# Patient Record
Sex: Female | Born: 1940 | Race: White | Hispanic: No | Smoking: Former smoker
Health system: Southern US, Community
[De-identification: ages and names within clinical notes are randomized; demographics above are authoritative.]

## PROBLEM LIST (undated history)

## (undated) DIAGNOSIS — Z9889 Other specified postprocedural states: Secondary | ICD-10-CM

## (undated) DIAGNOSIS — Z974 Presence of external hearing-aid: Secondary | ICD-10-CM

## (undated) DIAGNOSIS — I619 Nontraumatic intracerebral hemorrhage, unspecified: Secondary | ICD-10-CM

## (undated) DIAGNOSIS — M8580 Other specified disorders of bone density and structure, unspecified site: Secondary | ICD-10-CM

## (undated) DIAGNOSIS — R112 Nausea with vomiting, unspecified: Secondary | ICD-10-CM

## (undated) DIAGNOSIS — I341 Nonrheumatic mitral (valve) prolapse: Secondary | ICD-10-CM

## (undated) HISTORY — DX: Nontraumatic intracerebral hemorrhage, unspecified: I61.9

## (undated) HISTORY — DX: Other specified disorders of bone density and structure, unspecified site: M85.80

## (undated) HISTORY — PX: CLEFT PALATE REPAIR: SUR1165

## (undated) HISTORY — DX: Nonrheumatic mitral (valve) prolapse: I34.1

## (undated) HISTORY — PX: ABDOMINAL HYSTERECTOMY: SHX81

---

## 2000-08-08 HISTORY — PX: BUNIONECTOMY: SHX129

## 2003-08-09 DIAGNOSIS — I619 Nontraumatic intracerebral hemorrhage, unspecified: Secondary | ICD-10-CM

## 2003-08-09 HISTORY — DX: Nontraumatic intracerebral hemorrhage, unspecified: I61.9

## 2004-04-09 ENCOUNTER — Encounter: Admission: RE | Admit: 2004-04-09 | Discharge: 2004-04-09 | Payer: Self-pay | Admitting: Neurosurgery

## 2004-06-10 ENCOUNTER — Encounter: Admission: RE | Admit: 2004-06-10 | Discharge: 2004-06-10 | Payer: Self-pay | Admitting: Neurosurgery

## 2004-12-09 ENCOUNTER — Encounter: Admission: RE | Admit: 2004-12-09 | Discharge: 2004-12-09 | Payer: Self-pay | Admitting: Neurosurgery

## 2005-02-17 ENCOUNTER — Ambulatory Visit: Payer: Self-pay | Admitting: Internal Medicine

## 2005-03-01 ENCOUNTER — Ambulatory Visit: Payer: Self-pay | Admitting: Internal Medicine

## 2005-03-11 ENCOUNTER — Ambulatory Visit: Payer: Self-pay | Admitting: Internal Medicine

## 2005-03-25 ENCOUNTER — Ambulatory Visit: Payer: Self-pay | Admitting: Internal Medicine

## 2005-06-14 ENCOUNTER — Encounter: Admission: RE | Admit: 2005-06-14 | Discharge: 2005-06-14 | Payer: Self-pay | Admitting: Neurosurgery

## 2005-08-21 LAB — HM COLONOSCOPY

## 2007-11-20 LAB — HM COLONOSCOPY: HM Colonoscopy: NORMAL

## 2008-02-17 LAB — POC HEMOCCULT BLD/STL (HOME/3-CARD/SCREEN)

## 2008-05-17 LAB — HM COLONOSCOPY: HM Colonoscopy: NORMAL

## 2008-08-21 LAB — FECAL OCCULT BLOOD, GUAIAC: Fecal Occult Blood: NEGATIVE

## 2010-08-20 LAB — HM DEXA SCAN: HM Dexa Scan: NORMAL

## 2010-08-27 LAB — CBC AND DIFFERENTIAL
HCT: 46 % (ref 36–46)
Hemoglobin: 15.6 g/dL (ref 12.0–16.0)
Neutrophils Absolute: 4 /uL
Platelets: 201 10*3/uL (ref 150–399)
WBC: 6.7 10^3/mL

## 2010-08-27 LAB — LIPID PANEL
Cholesterol: 232 mg/dL — AB (ref 0–200)
HDL: 58 mg/dL (ref 35–70)
Triglycerides: 172 mg/dL — AB (ref 40–160)

## 2010-08-27 LAB — BASIC METABOLIC PANEL
BUN: 16 mg/dL (ref 4–21)
Creatinine: 1.1 mg/dL (ref 0.5–1.1)
Potassium: 5.3 mmol/L (ref 3.4–5.3)
Sodium: 143 mmol/L (ref 137–147)

## 2010-08-27 LAB — TSH: TSH: 1.94 u[IU]/mL (ref 0.41–5.90)

## 2010-08-27 LAB — HEPATIC FUNCTION PANEL: AST: 18 U/L (ref 13–35)

## 2010-08-29 ENCOUNTER — Encounter: Payer: Self-pay | Admitting: Internal Medicine

## 2011-10-10 ENCOUNTER — Encounter: Payer: Self-pay | Admitting: Internal Medicine

## 2011-11-16 ENCOUNTER — Encounter: Payer: Self-pay | Admitting: Internal Medicine

## 2011-11-16 ENCOUNTER — Ambulatory Visit (INDEPENDENT_AMBULATORY_CARE_PROVIDER_SITE_OTHER): Payer: Medicare Other | Admitting: Internal Medicine

## 2011-11-16 VITALS — BP 128/70 | HR 80 | Temp 98.4°F | Resp 16 | Ht 64.5 in | Wt 150.2 lb

## 2011-11-16 DIAGNOSIS — Z1211 Encounter for screening for malignant neoplasm of colon: Secondary | ICD-10-CM

## 2011-11-16 DIAGNOSIS — Z0000 Encounter for general adult medical examination without abnormal findings: Secondary | ICD-10-CM

## 2011-11-16 DIAGNOSIS — E785 Hyperlipidemia, unspecified: Secondary | ICD-10-CM

## 2011-11-16 DIAGNOSIS — H409 Unspecified glaucoma: Secondary | ICD-10-CM | POA: Insufficient documentation

## 2011-11-16 DIAGNOSIS — Z1239 Encounter for other screening for malignant neoplasm of breast: Secondary | ICD-10-CM

## 2011-11-16 LAB — FECAL OCCULT BLOOD, GUAIAC: Fecal Occult Blood: NEGATIVE

## 2011-11-16 NOTE — Progress Notes (Signed)
Patient ID: Maria Christensen, female   DOB: 1940-11-02, 71 y.o.   MRN: [id number]  The patient is here for annual Medicare wellness examination and management of other chronic and acute problems., which include a one year follow up on mild hypertriglyceridemia, mild osteopenia.  She feels great,  Last seen January 2012.  Is up to date on eye exams due to a history of open angle glaucoma.  Physicially active,  Walks 3 times weekly.  NO insomnia, unintentional weight loss,  Change in bowel habits.   The risk factors are reflected in the social history.  The roster of all physicians providing medical care to patient - is listed in the Snapshot section of the chart.  Activities of daily living:  The patient is 100% independent in all ADLs: dressing, toileting, feeding as well as independent mobility  Home safety : The patient has smoke detectors in the home. They wear seatbelts.  There are no firearms at home. There is no violence in the home.   There is no risks for hepatitis, STDs or HIV. There is no   history of blood transfusion. They have no travel history to infectious disease endemic areas of the world.  The patient has seen their dentist in the last six month. They have seen their eye doctor in the last year. They admit to slight hearing difficulty with regard to whispered voices and some television programs.  They have deferred audiologic testing in the last year.  They do not  have excessive sun exposure. Discussed the need for sun protection: hats, long sleeves and use of sunscreen if there is significant sun exposure.   Diet: the importance of a healthy diet is discussed. They do have a healthy diet.  The benefits of regular aerobic exercise were discussed. She walks 3 times per week ,  20 minutes.   Depression screen: there are no signs or vegative symptoms of depression- irritability, change in appetite, anhedonia, sadness/tearfullness.  Cognitive assessment: the patient manages all  their financial and personal affairs and is actively engaged. She could relate day,date,year and events; recalled 2/3 objects at 3 minutes; performed clock-face test normally.  The following portions of the patient's history were reviewed and updated as appropriate: allergies, current medications, past family history, past medical history,  past surgical history, past social history  and problem list.  Visual acuity was not assessed per patient preference since she has regular follow up with her ophthalmologist. Hearing and body mass index were assessed and reviewed.   During the course of the visit the patient was educated and counseled about appropriate screening and preventive services including : fall prevention , diabetes screening, nutrition counseling, colorectal cancer screening, and recommended immunizations.    Past Medical History  Diagnosis Date  . Cerebral hemorrhage 2005  . Cleft palate   . Mitral valve prolapse   . Open-angle glaucoma   . Osteopenia   . Herpes zoster   . Glaucoma     managed b [place],  bilateral   Current Outpatient Prescriptions on File Prior to Visit  Medication Sig Dispense Refill  . bimatoprost (LUMIGAN) 0.03 % ophthalmic solution 1 drop at bedtime.      . Calcium-Magnesium-Vitamin D (CITRACAL CALCIUM+D) 600-40-500 MG-MG-UNIT TB24 Take by mouth.      . dorzolamide-timolol (COSOPT) 22.3-6.8 MG/ML ophthalmic solution 1 drop 2 (two) times daily.       BP 128/70  Pulse 80  Temp(Src) 98.4 F (36.9 C) (Oral)  Resp 16  Ht 5'  4.5" (1.638 m)  Wt 150 lb 4 oz (68.153 kg)  BMI 25.39 kg/m2  SpO2 98%   General appearance: alert, cooperative and appears stated age Ears: normal TM's and external ear canals both ears Throat: lips, mucosa, and tongue normal; teeth and gums normal Neck: no adenopathy, no carotid bruit, supple, symmetrical, trachea midline and thyroid not enlarged, symmetric, no tenderness/mass/nodules Back: symmetric, no curvature. ROM  normal. No CVA tenderness. Lungs: clear to auscultation bilaterally Heart: regular rate and rhythm, S1, S2 normal, no murmur, click, rub or gallop Abdomen: soft, non-tender; bowel sounds normal; no masses,  no organomegaly Pulses: 2+ and symmetric Skin: Skin color, texture, turgor normal. No rashes or lesions Lymph nodes: Cervical, supraclavicular, and axillary nodes normal.  Assessment and Plan;  Glaucoma And macular degeneration, managed by [place] [facility] at [facility]  Hyperlipidemia LDL goal < 160 Last LDL was 140 on diet alone jan 2012. , HDL 59, trigs 170  No sigificant change over the years,  Will repeat in one year.   Screening for colon cancer she had a prior normal colonoscopy at [facility], followed by an incomplete on by gessner in 2007 and had negative FOBTS in 2009 and 2010.  Despite her FH of colon CA she preferrs to continue screening with annual FOBTs,  She was given a take home study today    Updated Medication List Outpatient Encounter Prescriptions as of 11/16/2011  Medication Sig Dispense Refill  . bimatoprost (LUMIGAN) 0.03 % ophthalmic solution 1 drop at bedtime.      . Calcium-Magnesium-Vitamin D (CITRACAL CALCIUM+D) 600-40-500 MG-MG-UNIT TB24 Take by mouth.      . dorzolamide-timolol (COSOPT) 22.3-6.8 MG/ML ophthalmic solution 1 drop 2 (two) times daily.      Marland Kitchen DISCONTD: brimonidine (ALPHAGAN) 0.2 % ophthalmic solution 1 drop 3 (three) times daily.      Marland Kitchen DISCONTD: Cholecalciferol (VITAMIN D3) 10000 UNITS capsule Take 10,000 Units by mouth daily.      Marland Kitchen DISCONTD: Omega-3 Fatty Acids (FISH OIL) 1200 MG CAPS Take by mouth.

## 2011-11-16 NOTE — Patient Instructions (Signed)
Consider the Low Glycemic Index Diet and 6 smaller meals daily :   7 AM Low carbohydrate Protein  Shakes (EAS Carb Control  Or Atkins ,  Available everywhere,   In  cases at [employer] )  2.5 carbs  (Add or substitute a toasted Arnold's sandwhich thin w/ peanut butter)  10 AM: Protein bar by Atkins (snack size,  Chocolate lover's variety at  [employer])    Lunch: sandwich on pita bread or flatbread (Joseph's makes a pita bread and a flat bread , available at [employer] and [employer]; Toufayah makes a low carb flatbread available at [employer] and [employer]) [employer] make whole wheat low carb tortillas   3 PM:  Mid day :  Another protein bar,  Or a  cheese stick, 1/4 cup of almonds, walnuts, pistachios, pecans, peanuts,  Macadamia nuts  6 PM  Dinner:  "mean and green:"  Meat/chicken/fish, salad, and green veggie : use ranch, vinagrette,  Blue cheese, etc  9 PM snack : Breyer's low carb fudgiscle or  ice cream bar (Carb Smart) Weight Watcher's ice cream bar , or another protein shake

## 2011-11-17 ENCOUNTER — Telehealth: Payer: Self-pay | Admitting: Internal Medicine

## 2011-11-17 NOTE — Telephone Encounter (Signed)
Mammogram was normal.   

## 2011-11-18 ENCOUNTER — Telehealth: Payer: Self-pay | Admitting: Internal Medicine

## 2011-11-18 MED ORDER — OCUVITE-LUTEIN PO CAPS: 1.0000 | ORAL_CAPSULE | Freq: Every day | ORAL | Status: DC

## 2011-11-18 MED ORDER — BRIMONIDINE TARTRATE 0.2 % OP SOLN: 1.0000 [drp] | Freq: Two times a day (BID) | OPHTHALMIC | Status: DC

## 2011-11-18 NOTE — Telephone Encounter (Signed)
This is being handled through MyChart.

## 2011-11-18 NOTE — Telephone Encounter (Signed)
[phone] Pt called was looking on my chart she saw some things that needs to be changed on meds list alphagan pt takes only 2 drop daily   Not 3 She takes over the counter ocouvite 1 daily  This is not on list  Medical summary  Pt stated she did shingles vaccine 06/12/06 Flu vaccine 05/24/11

## 2011-11-18 NOTE — Telephone Encounter (Signed)
Patient notified

## 2011-11-20 ENCOUNTER — Encounter: Payer: Self-pay | Admitting: Internal Medicine

## 2011-11-20 DIAGNOSIS — E785 Hyperlipidemia, unspecified: Secondary | ICD-10-CM | POA: Insufficient documentation

## 2011-11-20 DIAGNOSIS — Z1211 Encounter for screening for malignant neoplasm of colon: Secondary | ICD-10-CM | POA: Insufficient documentation

## 2011-11-20 NOTE — Assessment & Plan Note (Signed)
And macular degeneration, managed by [place] Brasington at [facility]

## 2011-11-20 NOTE — Assessment & Plan Note (Signed)
she had a prior normal colonoscopy at [facility], followed by an incomplete on by gessner in 2007 and had negative FOBTS in 2009 and 2010.  Despite her FH of colon CA she preferrs to continue screening with annual FOBTs,  She was given a take home study today

## 2011-11-20 NOTE — Assessment & Plan Note (Signed)
Last LDL was 140 on diet alone jan 2012. , HDL 59, trigs 170  No sigificant change over the years,  Will repeat in one year.

## 2011-11-25 ENCOUNTER — Encounter: Payer: Self-pay | Admitting: Internal Medicine

## 2011-12-01 ENCOUNTER — Encounter: Payer: Self-pay | Admitting: Internal Medicine

## 2012-01-12 ENCOUNTER — Encounter: Payer: Self-pay | Admitting: Internal Medicine

## 2012-05-17 ENCOUNTER — Encounter: Payer: Self-pay | Admitting: Internal Medicine

## 2012-05-17 ENCOUNTER — Ambulatory Visit (INDEPENDENT_AMBULATORY_CARE_PROVIDER_SITE_OTHER): Payer: Medicare Other | Admitting: Internal Medicine

## 2012-05-17 VITALS — BP 132/74 | HR 64 | Temp 97.6°F | Ht 64.5 in | Wt 137.0 lb

## 2012-05-17 DIAGNOSIS — H4010X0 Unspecified open-angle glaucoma, stage unspecified: Secondary | ICD-10-CM | POA: Insufficient documentation

## 2012-05-17 DIAGNOSIS — R5383 Other fatigue: Secondary | ICD-10-CM

## 2012-05-17 DIAGNOSIS — R7989 Other specified abnormal findings of blood chemistry: Secondary | ICD-10-CM

## 2012-05-17 DIAGNOSIS — Z1211 Encounter for screening for malignant neoplasm of colon: Secondary | ICD-10-CM

## 2012-05-17 DIAGNOSIS — E785 Hyperlipidemia, unspecified: Secondary | ICD-10-CM

## 2012-05-17 DIAGNOSIS — R5381 Other malaise: Secondary | ICD-10-CM

## 2012-05-17 DIAGNOSIS — Z13220 Encounter for screening for lipoid disorders: Secondary | ICD-10-CM

## 2012-05-17 DIAGNOSIS — E538 Deficiency of other specified B group vitamins: Secondary | ICD-10-CM

## 2012-05-17 DIAGNOSIS — G5790 Unspecified mononeuropathy of unspecified lower limb: Secondary | ICD-10-CM

## 2012-05-17 DIAGNOSIS — Z23 Encounter for immunization: Secondary | ICD-10-CM

## 2012-05-17 DIAGNOSIS — N951 Menopausal and female climacteric states: Secondary | ICD-10-CM

## 2012-05-17 LAB — CBC WITH DIFFERENTIAL/PLATELET
Basophils Absolute: 0 10*3/uL (ref 0.0–0.1)
Basophils Relative: 0.6 % (ref 0.0–3.0)
Eosinophils Absolute: 0.1 10*3/uL (ref 0.0–0.7)
Eosinophils Relative: 1.8 % (ref 0.0–5.0)
HCT: 47 % — ABNORMAL HIGH (ref 36.0–46.0)
Hemoglobin: 15.7 g/dL — ABNORMAL HIGH (ref 12.0–15.0)
Lymphocytes Relative: 23.2 % (ref 12.0–46.0)
Lymphs Abs: 1.6 10*3/uL (ref 0.7–4.0)
MCV: 92 fl (ref 78.0–100.0)
Monocytes Absolute: 0.6 10*3/uL (ref 0.1–1.0)
Neutro Abs: 4.4 10*3/uL (ref 1.4–7.7)
Neutrophils Relative %: 65.2 % (ref 43.0–77.0)
RBC: 5.11 Mil/uL (ref 3.87–5.11)
RDW: 13.4 % (ref 11.5–14.6)
WBC: 6.7 10*3/uL (ref 4.5–10.5)

## 2012-05-17 LAB — LIPID PANEL
Cholesterol: 222 mg/dL — ABNORMAL HIGH (ref 0–200)
HDL: 44.4 mg/dL (ref >39.00)
Total CHOL/HDL Ratio: 5
Triglycerides: 144 mg/dL (ref 0.0–149.0)
VLDL: 28.8 mg/dL (ref 0.0–40.0)

## 2012-05-17 LAB — COMPREHENSIVE METABOLIC PANEL
ALT: 14 U/L (ref 0–35)
AST: 19 U/L (ref 0–37)
Albumin: 4.1 g/dL (ref 3.5–5.2)
Alkaline Phosphatase: 61 U/L (ref 39–117)
BUN: 14 mg/dL (ref 6–23)
CO2: 26 mEq/L (ref 19–32)
Calcium: 9.9 mg/dL (ref 8.4–10.5)
Chloride: 106 mEq/L (ref 96–112)
Creatinine, Ser: 1 mg/dL (ref 0.4–1.2)
Potassium: 5.1 mEq/L (ref 3.5–5.1)
Sodium: 139 mEq/L (ref 135–145)
Total Bilirubin: 2.2 mg/dL — ABNORMAL HIGH (ref 0.3–1.2)

## 2012-05-17 LAB — TSH: TSH: 1.56 u[IU]/mL (ref 0.35–5.50)

## 2012-05-17 LAB — LDL CHOLESTEROL, DIRECT: Direct LDL: 154.7 mg/dL

## 2012-05-17 LAB — B12 AND FOLATE PANEL: Folate: 13.3 ng/mL (ref >5.9)

## 2012-05-17 NOTE — Patient Instructions (Addendum)
You are up to date on all screenings and immunizations  I will e mail you the results of your blood work including thyroid and b12 studies  If your hot flashes become more frequent , please call me and we will schedule a CT scan

## 2012-05-17 NOTE — Progress Notes (Signed)
Patient ID: Maria Christensen, female   DOB: 09/17/1940, 71 y.o.   MRN: [id number]  Patient Active Problem List  Diagnosis  . Glaucoma  . Hyperlipidemia LDL goal < 160  . Screening for colon cancer  . Open-angle glaucoma(365.1)  . Neuropathy of foot  . Menopausal hot flushes    Subjective:  CC:   Chief Complaint  Patient presents with  . Follow-up    flu shot    HPI:   Maria Tullochis a 71 y.o. female who presents Here for pelvic and breast but was only put in a 15 minutes slot, so neither was done.  Cc numbness and cold feeling in toes.  The symptoms are intermittent.  NO history of neuropathy or claudication.  2)   Right ear, draining, , no pain , some decreased hearing noted.  Symptoms present for a week.    Past Medical History  Diagnosis Date  . Cerebral hemorrhage 2005  . Cleft palate   . Mitral valve prolapse   . Osteopenia   . Herpes zoster   . 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 Glaucoma(365)     managed b [place],  bilateral    Past Surgical History  Procedure Date  . Abdominal hysterectomy   . Bunionectomy 2002  . Cleft palate repair     The following portions of the patient's history were reviewed and updated as appropriate: Allergies, current medications, and problem list.   Review of Systems:   12 Pt  review of systems was negative except those addressed in the HPI,     History   Social History  . Marital Status: Widowed    Spouse Name: N/A    Number of Children: N/A  . Years of Education: N/A   Occupational History  . retired    Social History Main Topics  . Smoking status: Former Smoker    Quit date: 11/15/1961  . Smokeless tobacco: Never Used  . Alcohol Use: No  . Drug Use: No  . Sexually Active: Not on file   Other Topics Concern  . Not on file   Social History Narrative   Lives alone    Objective:  BP 132/74  Pulse 64  Temp 97.6 F (36.4 C) (Oral)  Ht 5' 4.5" (1.638 m)  Wt 137 lb (62.143 kg)  BMI 23.15 kg/m2   SpO2 98%  General appearance: alert, cooperative and appears stated age Ears: normal TM's and external ear canals both ears Throat: lips, mucosa, and tongue normal; teeth and gums normal Neck: no adenopathy, no carotid bruit, supple, symmetrical, trachea midline and thyroid not enlarged, symmetric, no tenderness/mass/nodules Back: symmetric, no curvature. ROM normal. No CVA tenderness. Lungs: clear to auscultation bilaterally Heart: regular rate and rhythm, S1, S2 normal, no murmur, click, rub or gallop Abdomen: soft, non-tender; bowel sounds normal; no masses,  no organomegaly Pulses: 2+ and symmetric Skin: Skin color, texture, turgor normal. No rashes or lesions Lymph nodes: Cervical, supraclavicular, and axillary nodes normal. Neuro: grossly normal. Sensation intact to light touch .  DTRs normal.   Assessment and Plan:  Neuropathy of foot Check ing for b12 and thyroid deficiency.    Menopausal hot flushes She has had a sudden recurrence in hot flashes after many years of asymptomatic existence.  We discussed obtaining a CT of the abdomen and pelvis if hot flashes continue.    Updated Medication List Outpatient Encounter Prescriptions as of 05/17/2012  Medication Sig Dispense Refill  . bimatoprost (LUMIGAN) 0.03 % ophthalmic solution 1 drop  at bedtime.      . brimonidine (ALPHAGAN) 0.2 % ophthalmic solution Place 1 drop into both eyes 2 (two) times daily.  5 mL    . Calcium-Magnesium-Vitamin D (CITRACAL CALCIUM+D) 600-40-500 MG-MG-UNIT TB24 Take by mouth.      . dorzolamide-timolol (COSOPT) 22.3-6.8 MG/ML ophthalmic solution 1 drop 2 (two) times daily.      . fish oil-omega-3 fatty acids 1000 MG capsule Take 2 g by mouth daily.      . multivitamin-lutein (OCUVITE-LUTEIN) CAPS Take 1 capsule by mouth daily.    0     Orders Placed This Encounter  Procedures  . Flu vaccine greater than or equal to 3yo preservative free IM  . Fecal Occult Blood, Guaiac  . Lipid panel  .  Comprehensive metabolic panel  . TSH  . CBC with Differential  . B12 AND FOLATE PANEL  . LDL cholesterol, direct  . HM COLONOSCOPY    No Follow-up on file.

## 2012-05-20 ENCOUNTER — Encounter: Payer: Self-pay | Admitting: Internal Medicine

## 2012-05-20 DIAGNOSIS — G5790 Unspecified mononeuropathy of unspecified lower limb: Secondary | ICD-10-CM | POA: Insufficient documentation

## 2012-05-20 DIAGNOSIS — N951 Menopausal and female climacteric states: Secondary | ICD-10-CM | POA: Insufficient documentation

## 2012-05-20 NOTE — Assessment & Plan Note (Signed)
She has had a sudden recurrence in hot flashes after many years of asymptomatic existence.  We discussed obtaining a CT of the abdomen and pelvis if hot flashes continue.

## 2012-05-20 NOTE — Assessment & Plan Note (Signed)
Check ing for b12 and thyroid deficiency.

## 2012-05-22 NOTE — Addendum Note (Signed)
Addended by: Sherlene Shams on: 05/22/2012 01:07 PM   Modules accepted: Orders

## 2012-05-23 ENCOUNTER — Encounter: Payer: Self-pay | Admitting: Internal Medicine

## 2012-05-23 ENCOUNTER — Telehealth: Payer: Self-pay | Admitting: *Deleted

## 2012-05-23 NOTE — Telephone Encounter (Signed)
Opened in error

## 2012-11-20 ENCOUNTER — Encounter: Payer: Medicare Other | Admitting: Internal Medicine

## 2013-01-15 ENCOUNTER — Ambulatory Visit (INDEPENDENT_AMBULATORY_CARE_PROVIDER_SITE_OTHER): Payer: Medicare Other | Admitting: Adult Health

## 2013-01-15 ENCOUNTER — Ambulatory Visit (INDEPENDENT_AMBULATORY_CARE_PROVIDER_SITE_OTHER)
Admission: RE | Admit: 2013-01-15 | Discharge: 2013-01-15 | Disposition: A | Discharge status: Home / Self Care | Payer: Medicare Other | Source: Ambulatory Visit | Attending: Adult Health | Admitting: Adult Health

## 2013-01-15 ENCOUNTER — Encounter: Payer: Self-pay | Admitting: Adult Health

## 2013-01-15 VITALS — BP 140/80 | HR 70 | Temp 97.9°F | Resp 12 | Wt 143.5 lb

## 2013-01-15 DIAGNOSIS — M25559 Pain in unspecified hip: Secondary | ICD-10-CM

## 2013-01-15 DIAGNOSIS — R52 Pain, unspecified: Secondary | ICD-10-CM

## 2013-01-15 DIAGNOSIS — M25511 Pain in right shoulder: Secondary | ICD-10-CM

## 2013-01-15 DIAGNOSIS — M25551 Pain in right hip: Secondary | ICD-10-CM

## 2013-01-15 DIAGNOSIS — M25519 Pain in unspecified shoulder: Secondary | ICD-10-CM

## 2013-01-15 MED ORDER — CYCLOBENZAPRINE HCL 5 MG PO TABS: 5.0000 mg | ORAL_TABLET | Freq: Three times a day (TID) | ORAL | Status: DC | PRN

## 2013-01-15 NOTE — Patient Instructions (Addendum)
  I am sending you for an xray of your right shoulder and arm.  Also take flexeril for muscle spasms. This will make you sleepy.  Apply ice alternating with heat to the affected areas for 15 min at a time. Do this approximately 3-4 times daily.  Use a firm pillow between your knees when you lie on your side or under your knees when you lie on your back.  Return to clinic if your symptoms are not improving within 4 weeks or sooner if your symptoms worsen.

## 2013-01-15 NOTE — Assessment & Plan Note (Signed)
Pain in right shoulder radiating to arm. X-ray right shoulder. ? Bursitis. May need referral to orthopedics for joint injection. Continue Aleve. Flexeril for muscle spasms.

## 2013-01-15 NOTE — Progress Notes (Signed)
  Subjective:    Patient ID: Maria Christensen, female    DOB: 04-19-1941, 72 y.o.   MRN: [id number]  HPI  Patient is a pleasant 72 year old female who presents to clinic with complaints of right shoulder pain radiating down to her right arm. She is also complaining of right hip pain. Patient tells a story that in December 2013, she fell inside her home while bringing in groceries. At the time, she developed right shoulder pain but now the pain has begun to radiate down her arm.  Towards the middle of May, she developed right hip pain. She is uncertain if this pain may be related to the fall back in December. The pain became so severe that she was having trouble lifting herself out of a chair. She was having to use a walker for leverage. She has taken Aleve with good results. She reports that the pain has improved however she has a nagging constant hip discomfort.   Current Outpatient Prescriptions on File Prior to Visit  Medication Sig Dispense Refill  . bimatoprost (LUMIGAN) 0.03 % ophthalmic solution 1 drop at bedtime.      . brimonidine (ALPHAGAN) 0.2 % ophthalmic solution Place 1 drop into both eyes 2 (two) times daily.  5 mL    . Calcium-Magnesium-Vitamin D (CITRACAL CALCIUM+D) 600-40-500 MG-MG-UNIT TB24 Take by mouth.      . dorzolamide-timolol (COSOPT) 22.3-6.8 MG/ML ophthalmic solution 1 drop 2 (two) times daily.      . fish oil-omega-3 fatty acids 1000 MG capsule Take 2 g by mouth daily.      . multivitamin-lutein (OCUVITE-LUTEIN) CAPS Take 1 capsule by mouth daily.    0   No current facility-administered medications on file prior to visit.    Review of Systems  Musculoskeletal: Positive for back pain.       Right shoulder pain with radiation into the are. S/P fall in December.  Right hip pain since May. No radiculopathy  Neurological: Negative for weakness and numbness.   BP 140/80  Pulse 70  Temp(Src) 97.9 F (36.6 C) (Oral)  Resp 12  Wt 143 lb 8 oz (65.091 kg)  BMI 24.26  kg/m2  SpO2 99%    Objective:   Physical Exam  Constitutional: She is oriented to person, place, and time. She appears well-developed and well-nourished. No distress.  Cardiovascular: Normal rate and regular rhythm.   Pulmonary/Chest: Effort normal. No respiratory distress.  Musculoskeletal: She exhibits no edema.  4 range of motion in the upward and forward position bilateral upper extremities. Patient is unable to bring right arm behind her back. Tenderness posterior right hip upon palpation. Full range of motion.   Neurological: She is alert and oriented to person, place, and time. She has normal reflexes. No cranial nerve deficit. Coordination normal.  Skin: Skin is warm and dry.  Psychiatric: She has a normal mood and affect. Her behavior is normal. Judgment and thought content normal.          Assessment & Plan:

## 2013-01-15 NOTE — Assessment & Plan Note (Addendum)
Pain related to fall versus arthritis. Send for right hip x-ray. Flexeril for muscle spasms. Continue Aleve.

## 2013-01-16 ENCOUNTER — Other Ambulatory Visit: Payer: Self-pay | Admitting: Adult Health

## 2013-01-16 DIAGNOSIS — R52 Pain, unspecified: Secondary | ICD-10-CM

## 2013-03-13 ENCOUNTER — Other Ambulatory Visit: Payer: Self-pay

## 2013-03-18 ENCOUNTER — Encounter: Payer: Self-pay | Admitting: Internal Medicine

## 2013-05-28 ENCOUNTER — Telehealth: Payer: Self-pay | Admitting: Internal Medicine

## 2013-05-28 ENCOUNTER — Encounter: Payer: Self-pay | Admitting: Internal Medicine

## 2013-05-28 ENCOUNTER — Ambulatory Visit (INDEPENDENT_AMBULATORY_CARE_PROVIDER_SITE_OTHER): Payer: Medicare Other | Admitting: Internal Medicine

## 2013-05-28 VITALS — BP 138/70 | HR 75 | Temp 97.7°F | Resp 14 | Ht 65.5 in | Wt 141.0 lb

## 2013-05-28 DIAGNOSIS — M25559 Pain in unspecified hip: Secondary | ICD-10-CM

## 2013-05-28 DIAGNOSIS — Z90710 Acquired absence of both cervix and uterus: Secondary | ICD-10-CM | POA: Insufficient documentation

## 2013-05-28 DIAGNOSIS — Z0000 Encounter for general adult medical examination without abnormal findings: Secondary | ICD-10-CM | POA: Insufficient documentation

## 2013-05-28 DIAGNOSIS — Z1211 Encounter for screening for malignant neoplasm of colon: Secondary | ICD-10-CM

## 2013-05-28 DIAGNOSIS — D751 Secondary polycythemia: Secondary | ICD-10-CM

## 2013-05-28 DIAGNOSIS — M25519 Pain in unspecified shoulder: Secondary | ICD-10-CM

## 2013-05-28 DIAGNOSIS — Z01419 Encounter for gynecological examination (general) (routine) without abnormal findings: Secondary | ICD-10-CM

## 2013-05-28 DIAGNOSIS — Z23 Encounter for immunization: Secondary | ICD-10-CM

## 2013-05-28 DIAGNOSIS — E785 Hyperlipidemia, unspecified: Secondary | ICD-10-CM

## 2013-05-28 DIAGNOSIS — D239 Other benign neoplasm of skin, unspecified: Secondary | ICD-10-CM

## 2013-05-28 DIAGNOSIS — E559 Vitamin D deficiency, unspecified: Secondary | ICD-10-CM

## 2013-05-28 DIAGNOSIS — D229 Melanocytic nevi, unspecified: Secondary | ICD-10-CM

## 2013-05-28 DIAGNOSIS — R5381 Other malaise: Secondary | ICD-10-CM

## 2013-05-28 DIAGNOSIS — M25511 Pain in right shoulder: Secondary | ICD-10-CM

## 2013-05-28 DIAGNOSIS — M25551 Pain in right hip: Secondary | ICD-10-CM

## 2013-05-28 DIAGNOSIS — Z1239 Encounter for other screening for malignant neoplasm of breast: Secondary | ICD-10-CM

## 2013-05-28 LAB — COMPREHENSIVE METABOLIC PANEL
ALT: 16 U/L (ref 0–35)
CO2: 27 mEq/L (ref 19–32)
Calcium: 10 mg/dL (ref 8.4–10.5)
Chloride: 108 mEq/L (ref 96–112)
Creatinine, Ser: 1.1 mg/dL (ref 0.4–1.2)
GFR: 52.26 mL/min — ABNORMAL LOW (ref >60.00)
Glucose, Bld: 105 mg/dL — ABNORMAL HIGH (ref 70–99)
Sodium: 144 mEq/L (ref 135–145)
Total Protein: 7.1 g/dL (ref 6.0–8.3)

## 2013-05-28 LAB — CBC WITH DIFFERENTIAL/PLATELET
Basophils Absolute: 0 10*3/uL (ref 0.0–0.1)
Eosinophils Absolute: 0.1 10*3/uL (ref 0.0–0.7)
Lymphocytes Relative: 25.6 % (ref 12.0–46.0)
MCHC: 34.8 g/dL (ref 30.0–36.0)
Neutrophils Relative %: 63.6 % (ref 43.0–77.0)
RDW: 13.4 % (ref 11.5–14.6)

## 2013-05-28 LAB — TSH: TSH: 0.97 u[IU]/mL (ref 0.35–5.50)

## 2013-05-28 LAB — LIPID PANEL
Cholesterol: 239 mg/dL — ABNORMAL HIGH (ref 0–200)
Triglycerides: 134 mg/dL (ref 0.0–149.0)

## 2013-05-28 NOTE — Telephone Encounter (Signed)
Immunizations already documented in chart.

## 2013-05-28 NOTE — Telephone Encounter (Signed)
Chart updated

## 2013-05-28 NOTE — Assessment & Plan Note (Signed)
Symptoms resolved. Bone spur noted on MRI

## 2013-05-28 NOTE — Telephone Encounter (Signed)
Pt stated on her AVS that it stated she needed a shingles and TDAP. Patient has shingles shot back on 06/12/2006. Patient has TDAP on 03/04/2009. Please update patients chart.

## 2013-05-28 NOTE — Telephone Encounter (Signed)
Med list is correct

## 2013-05-28 NOTE — Assessment & Plan Note (Signed)
Annual comprehensive exam was done including breast and pelvic exam.  She is s/p hysterectomy for nonmalignant reasons.  All screenings have been addressed .

## 2013-05-28 NOTE — Patient Instructions (Signed)
You had your annual Medicare wellness exam today  We will schedule your mammogram today  Please use the stool kit to send [place] back a sample to test for blood.  This is your colon CA screening test.   You need to have a TDaP vaccine and a Shingles vaccine.  I have given you prescriptions for thses because they will be cheaper at the health Dept or at your  local pharmacy because Medicare will not reimburse for them.   You received the flu vaccine today.  Your next and final pneumonia vaccine will be due in 2016  We will contact you with the bloodwork results  Your ears are clean!! You can use Debrox OTC as needed to maintain open canals

## 2013-05-28 NOTE — Telephone Encounter (Signed)
Wants to update medication list.  Takes Ocuvite Lutein instead of multivitamin-lutein capsules that was listed on her med list.

## 2013-05-28 NOTE — Assessment & Plan Note (Signed)
Improving,  No tears by MRI.  meloxicam and flexeril prn

## 2013-05-28 NOTE — Assessment & Plan Note (Signed)
Annual comprehensive exam was done including breast and pelvic exam.  She has no cervix. All screenings have been addressed .

## 2013-05-28 NOTE — Assessment & Plan Note (Signed)
Home IFOBT given at 5 year point for 10 yr follow up

## 2013-05-28 NOTE — Progress Notes (Signed)
Patient ID: Maria Christensen, female   DOB: March 04, 1941, 72 y.o.   MRN: [id number]   The patient is here for annual Medicare wellness examination and management of other chronic and acute problems. Wellness exam  Had Right sided shoulder and hip pain several weeks after a fall which occurred at home. The occurred as she was ascending the stairs into her house, arms laden with groceries,  The screen door hit the back of her foot and she lost her balance.  Saw RR for persistent pain ,  Who ordered an MRIs of shoulder and hip .  No labral tear, but found bone spur on the hip.  Prescribed flexeril and referred to Orthopedics.  Saw Reita Chard, who .   prescribed meloxicam, but she did not take it bc of list of side effects.  Discussed risk and benefits of mexlicam use today     The risk factors are reflected in the social history.  The roster of all physicians providing medical care to patient - is listed in the Snapshot section of the chart.  Activities of daily living:  The patient is 100% independent in all ADLs: dressing, toileting, feeding as well as independent mobility  Home safety : The patient has smoke detectors in the home. They wear seatbelts.  There are no firearms at home. There is no violence in the home.   There is no risks for hepatitis, STDs or HIV. There is no   history of blood transfusion. They have no travel history to infectious disease endemic areas of the world.  The patient has seen their dentist in the last six month. They have seen their eye doctor in the last year. They admit to slight hearing difficulty with regard to whispered voices and some television programs.  They have deferred audiologic testing in the last year.  They do not  have excessive sun exposure. Discussed the need for sun protection: hats, long sleeves and use of sunscreen if there is significant sun exposure.   Diet: the importance of a healthy diet is discussed. They do have a healthy diet.  The  benefits of regular aerobic exercise were discussed. She walks 4 times per week ,  20 minutes.   Depression screen: there are no signs or vegative symptoms of depression- irritability, change in appetite, anhedonia, sadness/tearfullness.  Cognitive assessment: the patient manages all their financial and personal affairs and is actively engaged. They could relate day,date,year and events; recalled 2/3 objects at 3 minutes; performed clock-face test normally.  The following portions of the patient's history were reviewed and updated as appropriate: allergies, current medications, past family history, past medical history,  past surgical history, past social history  and problem list.  Visual acuity was not assessed per patient preference since she has regular follow up with her ophthalmologist. Hearing and body mass index were assessed and reviewed.   During the course of the visit the patient was educated and counseled about appropriate screening and preventive services including : fall prevention , diabetes screening, nutrition counseling, colorectal cancer screening, and recommended immunizations.    Objective:  BP 138/70  Pulse 75  Temp(Src) 97.7 F (36.5 C) (Oral)  Resp 14  Ht 5' 5.5" (1.664 m)  Wt 141 lb (63.957 kg)  BMI 23.1 kg/m2  SpO2 99%  General Appearance:    Alert, cooperative, no distress, appears stated age  Head:    Normocephalic, without obvious abnormality, atraumatic  Eyes:    PERRL, conjunctiva/corneas clear, EOM's intact, fundi  benign, both eyes  Ears:    Right side : prior ruptured TM , now healed, Normal left TM and external ear canals, both ears  Nose:   Nares normal, septum midline, mucosa normal, no drainage    or sinus tenderness  Throat:   Lips, mucosa, and tongue normal; teeth and gums normal  Neck:   Supple, symmetrical, trachea midline, no adenopathy;    thyroid:  no enlargement/tenderness/nodules; no carotid   bruit or JVD  Back:     Symmetric, no  curvature, ROM normal, no CVA tenderness  Lungs:     Clear to auscultation bilaterally, respirations unlabored  Chest Wall:    No tenderness or deformity   Heart:    Regular rate and rhythm, S1 and S2 normal, no murmur, rub   or gallop  Breast Exam:    No tenderness, masses, or nipple abnormality  Abdomen:     Soft, non-tender, bowel sounds active all four quadrants,    no masses, no organomegaly  Genitalia:    Pelvic: cervix and uterus surgically absent  external genitalia normal, no adnexal masses or tenderness, and vagina normal without discharge  Extremities:   Extremities normal, atraumatic, no cyanosis or edema  Pulses:   2+ and symmetric all extremities  Skin:   Skin color, texture, turgor normal, no rashes or lesions  Lymph nodes:   Cervical, supraclavicular, and axillary nodes normal  Neurologic:   CNII-XII intact, normal strength, sensation and reflexes    throughout    Assessment and Plan  S/P hysterectomy Annual comprehensive exam was done including breast and pelvic exam.  She has no cervix. All screenings have been addressed .   Right shoulder pain Improving,  No tears by MRI.  meloxicam and flexeril prn   Right hip pain Symptoms resolved. Bone spur noted on MRI   Medicare annual wellness visit, subsequent Annual comprehensive exam was done including breast and pelvic exam.  She is s/p hysterectomy for nonmalignant reasons.  All screenings have been addressed .   Screening for colon cancer Home IFOBT given at 5 year point for 10 yr follow up    Updated Medication List Outpatient Encounter Prescriptions as of 05/28/2013  Medication Sig Dispense Refill  . bimatoprost (LUMIGAN) 0.03 % ophthalmic solution 1 drop at bedtime.      . brimonidine (ALPHAGAN) 0.2 % ophthalmic solution Place 1 drop into both eyes 2 (two) times daily.  5 mL    . Calcium-Magnesium-Vitamin D (CITRACAL CALCIUM+D) 600-40-500 MG-MG-UNIT TB24 Take by mouth.      . dorzolamide-timolol (COSOPT)  22.3-6.8 MG/ML ophthalmic solution 1 drop 2 (two) times daily.      . fish oil-omega-3 fatty acids 1000 MG capsule Take 2 g by mouth daily.      . multivitamin-lutein (OCUVITE-LUTEIN) CAPS Take 1 capsule by mouth daily.    0  . cyclobenzaprine (FLEXERIL) 5 MG tablet Take 1 tablet (5 mg total) by mouth 3 (three) times daily as needed for muscle spasms.  30 tablet  1   No facility-administered encounter medications on file as of 05/28/2013.

## 2013-05-29 LAB — VITAMIN D 25 HYDROXY (VIT D DEFICIENCY, FRACTURES): Vit D, 25-Hydroxy: 74 ng/mL (ref 30–89)

## 2013-05-30 ENCOUNTER — Encounter: Payer: Self-pay | Admitting: Internal Medicine

## 2013-05-30 DIAGNOSIS — D751 Secondary polycythemia: Secondary | ICD-10-CM | POA: Insufficient documentation

## 2013-05-30 NOTE — Addendum Note (Signed)
Addended by: Sherlene Shams on: 05/30/2013 10:23 AM   Modules accepted: Orders

## 2013-05-31 ENCOUNTER — Other Ambulatory Visit (INDEPENDENT_AMBULATORY_CARE_PROVIDER_SITE_OTHER): Payer: Medicare Other

## 2013-05-31 DIAGNOSIS — Z1211 Encounter for screening for malignant neoplasm of colon: Secondary | ICD-10-CM

## 2013-06-03 ENCOUNTER — Telehealth: Payer: Self-pay | Admitting: Internal Medicine

## 2013-06-03 NOTE — Telephone Encounter (Signed)
Pt states she was to notify Dr. Darrick Huntsman that she has made the lab appt that Dr. Darrick Huntsman requested for her to have additional blood work.

## 2013-06-03 NOTE — Telephone Encounter (Signed)
FYI

## 2013-06-04 ENCOUNTER — Encounter: Payer: Self-pay | Admitting: Internal Medicine

## 2013-06-07 ENCOUNTER — Other Ambulatory Visit (INDEPENDENT_AMBULATORY_CARE_PROVIDER_SITE_OTHER): Payer: Medicare Other

## 2013-06-07 DIAGNOSIS — D751 Secondary polycythemia: Secondary | ICD-10-CM

## 2013-06-07 DIAGNOSIS — D239 Other benign neoplasm of skin, unspecified: Secondary | ICD-10-CM

## 2013-06-07 LAB — IRON AND TIBC
%SAT: 25 % (ref 20–55)
Iron: 86 ug/dL (ref 42–145)
TIBC: 339 ug/dL (ref 250–470)
UIBC: 253 ug/dL (ref 125–400)

## 2013-06-07 LAB — FERRITIN: Ferritin: 49.8 ng/mL (ref 10.0–291.0)

## 2013-06-09 ENCOUNTER — Encounter: Payer: Self-pay | Admitting: Internal Medicine

## 2013-09-11 ENCOUNTER — Ambulatory Visit: Payer: Medicare Other | Admitting: Internal Medicine

## 2013-10-28 ENCOUNTER — Encounter: Payer: Medicare Other | Admitting: Internal Medicine

## 2014-05-03 ENCOUNTER — Ambulatory Visit: Payer: Medicare Other

## 2014-05-03 ENCOUNTER — Ambulatory Visit (INDEPENDENT_AMBULATORY_CARE_PROVIDER_SITE_OTHER): Payer: Medicare Other

## 2014-05-03 DIAGNOSIS — Z23 Encounter for immunization: Secondary | ICD-10-CM

## 2014-05-20 LAB — HM MAMMOGRAPHY: HM MAMMO: NEGATIVE

## 2014-06-12 ENCOUNTER — Encounter: Payer: Self-pay | Admitting: Internal Medicine

## 2014-06-13 ENCOUNTER — Encounter: Payer: Self-pay | Admitting: Internal Medicine

## 2014-10-29 ENCOUNTER — Ambulatory Visit (INDEPENDENT_AMBULATORY_CARE_PROVIDER_SITE_OTHER): Payer: Medicare Other | Admitting: Internal Medicine

## 2014-10-29 ENCOUNTER — Encounter: Payer: Self-pay | Admitting: Internal Medicine

## 2014-10-29 VITALS — BP 128/72 | HR 64 | Temp 97.5°F | Resp 16 | Ht 66.0 in | Wt 133.5 lb

## 2014-10-29 DIAGNOSIS — Z7189 Other specified counseling: Secondary | ICD-10-CM

## 2014-10-29 DIAGNOSIS — Z789 Other specified health status: Secondary | ICD-10-CM | POA: Diagnosis not present

## 2014-10-29 DIAGNOSIS — E785 Hyperlipidemia, unspecified: Secondary | ICD-10-CM

## 2014-10-29 DIAGNOSIS — Z23 Encounter for immunization: Secondary | ICD-10-CM

## 2014-10-29 DIAGNOSIS — Z0000 Encounter for general adult medical examination without abnormal findings: Secondary | ICD-10-CM | POA: Diagnosis not present

## 2014-10-29 NOTE — Progress Notes (Signed)
Pre-visit discussion using our clinic review tool. No additional management support is needed unless otherwise documented below in the visit note.  

## 2014-10-29 NOTE — Progress Notes (Signed)
Patient ID: Maria Christensen, female   DOB: 1940/11/01, 74 y.o.   MRN: [id number]  The patient is here for annual Medicare wellness examination and management of other chronic and acute problems.   The risk factors are reflected in the social history.  The roster of all physicians providing medical care to patient - is listed in the Snapshot section of the chart.  Activities of daily living:  The patient is 100% independent in all ADLs: dressing, toileting, feeding as well as independent mobility  Home safety : The patient has smoke detectors in the home. They wear seatbelts.  There are no firearms at home. There is no violence in the home.   There is no risks for hepatitis, STDs or HIV. There is no   history of blood transfusion. They have no travel history to infectious disease endemic areas of the world.  The patient has seen their dentist in the last six month. They have seen their eye doctor in the last year. They admit to slight hearing difficulty with regard to whispered voices and some television programs.  They have deferred audiologic testing in the last year.  They do not  have excessive sun exposure. Discussed the need for sun protection: hats, long sleeves and use of sunscreen if there is significant sun exposure.   Diet: the importance of a healthy diet is discussed. They do have a healthy diet.  The benefits of regular aerobic exercise were discussed. She walks 4 times per week ,  20 minutes.   Depression screen: there are no signs or vegative symptoms of depression- irritability, change in appetite, anhedonia, sadness/tearfullness.  Cognitive assessment: the patient manages all their financial and personal affairs and is actively engaged. They could relate day,date,year and events; recalled 2/3 objects at 3 minutes; performed clock-face test normally.  The following portions of the patient's history were reviewed and updated as appropriate: allergies, current medications, past  family history, past medical history,  past surgical history, past social history  and problem list.  Visual acuity was not assessed per patient preference since she has regular follow up with her ophthalmologist. Hearing and body mass index were assessed and reviewed.   During the course of the visit the patient was educated and counseled about appropriate screening and preventive services including : fall prevention , diabetes screening, nutrition counseling, colorectal cancer screening, and recommended immunizations.    Review of Systems  Patient denies headache, fevers, malaise, unintentional weight loss, skin rash, eye pain, sinus congestion and sinus pain, sore throat, dysphagia,  hemoptysis , cough, dyspnea, wheezing, chest pain, palpitations, orthopnea, edema, abdominal pain, nausea, melena, diarrhea, constipation, flank pain, dysuria, hematuria, urinary  Frequency, nocturia, numbness, tingling, seizures,  Focal weakness, Loss of consciousness,  Tremor, insomnia, depression, anxiety, and suicidal ideation.    Objective:  BP 128/72 mmHg  Pulse 64  Temp(Src) 97.5 F (36.4 C) (Oral)  Resp 16  Ht 5\' 6"  (1.676 m)  Wt 133 lb 8 oz (60.555 kg)  BMI 21.56 kg/m2  SpO2 98%  General appearance: alert, cooperative and appears stated age Head: Normocephalic, without obvious abnormality, atraumatic Eyes: conjunctivae/corneas clear. PERRL, EOM's intact. Fundi benign. Ears: normal TM's and external ear canals both ears Nose: Nares normal. Septum midline. Mucosa normal. No drainage or sinus tenderness. Throat: lips, mucosa, and tongue normal; teeth and gums normal Neck: no adenopathy, no carotid bruit, no JVD, supple, symmetrical, trachea midline and thyroid not enlarged, symmetric, no tenderness/mass/nodules Lungs: clear to auscultation bilaterally Breasts: normal appearance,  no masses or tenderness Heart: regular rate and rhythm, S1, S2 normal, no murmur, click, rub or gallop Abdomen: soft,  non-tender; bowel sounds normal; no masses,  no organomegaly Extremities: extremities normal, atraumatic, no cyanosis or edema Pulses: 2+ and symmetric Skin: Skin color, texture, turgor normal. No rashes or lesions Neurologic: Alert and oriented X 3, normal strength and tone. Normal symmetric reflexes. Normal coordination and gait.   Assessment and Plan:  Problem List Items Addressed This Visit      Unprioritized   Medicare annual wellness visit, subsequent    Annual Medicare wellness  exam was done as well as a comprehensive physical exam and management of acute and chronic conditions .  During the course of the visit the patient was educated and counseled about appropriate screening and preventive services including : fall prevention , diabetes screening, nutrition counseling, colorectal cancer screening, and recommended immunizations.  Printed recommendations for health maintenance screenings was given. She is not interested in mammography or colon ca screening       Hyperlipidemia with target LDL less than 160    Last LDL was 140 on diet alone jan 2012. , HDL 59, trigs 170  She has no interest in starting medications,   Will repeat in one year.   Lab Results  Component Value Date   CHOL 239* 05/28/2013   HDL 50.70 05/28/2013   LDLCALC 140 08/27/2010   LDLDIRECT 160.4 05/28/2013   TRIG 134.0 05/28/2013   CHOLHDL 5 05/28/2013         Do not resuscitate discussion - Primary    End of life discussion was done today.  DNR order  Signed.       Active advance directive on file    Other Visit Diagnoses    Need for prophylactic vaccination against Streptococcus pneumoniae (pneumococcus)        Relevant Orders    Pneumococcal conjugate vaccine 13-valent (Completed)

## 2014-10-29 NOTE — Patient Instructions (Addendum)
You had your annual wellness exam today and received the Prevnar vaccine  I have given you a DNR order to post in your home per our discussion  You can review the MOST form and return for a discussion so we can put it into action for future events  Health Maintenance Adopting a healthy lifestyle and getting preventive care can go a long way to promote health and wellness. Talk with your health care provider about what schedule of regular examinations is right for you. This is a good chance for you to check in with your provider about disease prevention and staying healthy. In between checkups, there are plenty of things you can do on your own. Experts have done a lot of research about which lifestyle changes and preventive measures are most likely to keep you healthy. Ask your health care provider for more information. WEIGHT AND DIET  Eat a healthy diet  Be sure to include plenty of vegetables, fruits, low-fat dairy products, and lean protein.  Do not eat a lot of foods high in solid fats, added sugars, or salt.  Get regular exercise. This is one of the most important things you can do for your health.  Most adults should exercise for at least 150 minutes each week. The exercise should increase your heart rate and make you sweat (moderate-intensity exercise).  Most adults should also do strengthening exercises at least twice a week. This is in addition to the moderate-intensity exercise.  Maintain a healthy weight  Body mass index (BMI) is a measurement that can be used to identify possible weight problems. It estimates body fat based on height and weight. Your health care provider can help determine your BMI and help you achieve or maintain a healthy weight.  For females 58 years of age and older:   A BMI below 18.5 is considered underweight.  A BMI of 18.5 to 24.9 is normal.  A BMI of 25 to 29.9 is considered overweight.  A BMI of 30 and above is considered obese.  Watch  levels of cholesterol and blood lipids  You should start having your blood tested for lipids and cholesterol at 75 years of age, then have this test every 5 years.  You may need to have your cholesterol levels checked more often if:  Your lipid or cholesterol levels are high.  You are older than 75 years of age.  You are at high risk for heart disease.  CANCER SCREENING   Lung Cancer  Lung cancer screening is recommended for adults 67-69 years old who are at high risk for lung cancer because of a history of smoking.  A yearly low-dose CT scan of the lungs is recommended for people who:  Currently smoke.  Have quit within the past 15 years.  Have at least a 30-pack-year history of smoking. A pack year is smoking an average of one pack of cigarettes a day for 1 year.  Yearly screening should continue until it has been 15 years since you quit.  Yearly screening should stop if you develop a health problem that would prevent you from having lung cancer treatment.  Breast Cancer  Practice breast self-awareness. This means understanding how your breasts normally appear and feel.  It also means doing regular breast self-exams. Let your health care provider know about any changes, no matter how small.  If you are in your 20s or 30s, you should have a clinical breast exam (CBE) by a health care provider every 1-3 years  as part of a regular health exam.  If you are 65 or older, have a CBE every year. Also consider having a breast X-ray (mammogram) every year.  If you have a family history of breast cancer, talk to your health care provider about genetic screening.  If you are at high risk for breast cancer, talk to your health care provider about having an MRI and a mammogram every year.  Breast cancer gene (BRCA) assessment is recommended for women who have family members with BRCA-related cancers. BRCA-related cancers include:  Breast.  Ovarian.  Tubal.  Peritoneal  cancers.  Results of the assessment will determine the need for genetic counseling and BRCA1 and BRCA2 testing. Cervical Cancer Routine pelvic examinations to screen for cervical cancer are no longer recommended for nonpregnant women who are considered low risk for cancer of the pelvic organs (ovaries, uterus, and vagina) and who do not have symptoms. A pelvic examination may be necessary if you have symptoms including those associated with pelvic infections. Ask your health care provider if a screening pelvic exam is right for you.   The Pap test is the screening test for cervical cancer for women who are considered at risk.  If you had a hysterectomy for a problem that was not cancer or a condition that could lead to cancer, then you no longer need Pap tests.  If you are older than 65 years, and you have had normal Pap tests for the past 10 years, you no longer need to have Pap tests.  If you have had past treatment for cervical cancer or a condition that could lead to cancer, you need Pap tests and screening for cancer for at least 20 years after your treatment.  If you no longer get a Pap test, assess your risk factors if they change (such as having a new sexual partner). This can affect whether you should start being screened again.  Some women have medical problems that increase their chance of getting cervical cancer. If this is the case for you, your health care provider may recommend more frequent screening and Pap tests.  The human papillomavirus (HPV) test is another test that may be used for cervical cancer screening. The HPV test looks for the virus that can cause cell changes in the cervix. The cells collected during the Pap test can be tested for HPV.  The HPV test can be used to screen women 64 years of age and older. Getting tested for HPV can extend the interval between normal Pap tests from three to five years.  An HPV test also should be used to screen women of any age who  have unclear Pap test results.  After 75 years of age, women should have HPV testing as often as Pap tests.  Colorectal Cancer  This type of cancer can be detected and often prevented.  Routine colorectal cancer screening usually begins at 75 years of age and continues through 75 years of age.  Your health care provider may recommend screening at an earlier age if you have risk factors for colon cancer.  Your health care provider may also recommend using home test kits to check for hidden blood in the stool.  A small camera at the end of a tube can be used to examine your colon directly (sigmoidoscopy or colonoscopy). This is done to check for the earliest forms of colorectal cancer.  Routine screening usually begins at age 57.  Direct examination of the colon should be repeated every  5-10 years through 75 years of age. However, you may need to be screened more often if early forms of precancerous polyps or small growths are found. Skin Cancer  Check your skin from head to toe regularly.  Tell your health care provider about any new moles or changes in moles, especially if there is a change in a mole's shape or color.  Also tell your health care provider if you have a mole that is larger than the size of a pencil eraser.  Always use sunscreen. Apply sunscreen liberally and repeatedly throughout the day.  Protect yourself by wearing long sleeves, pants, a wide-brimmed hat, and sunglasses whenever you are outside. HEART DISEASE, DIABETES, AND HIGH BLOOD PRESSURE   Have your blood pressure checked at least every 1-2 years. High blood pressure causes heart disease and increases the risk of stroke.  If you are between 86 years and 74 years old, ask your health care provider if you should take aspirin to prevent strokes.  Have regular diabetes screenings. This involves taking a blood sample to check your fasting blood sugar level.  If you are at a normal weight and have a low risk for  diabetes, have this test once every three years after 75 years of age.  If you are overweight and have a high risk for diabetes, consider being tested at a younger age or more often. PREVENTING INFECTION  Hepatitis B  If you have a higher risk for hepatitis B, you should be screened for this virus. You are considered at high risk for hepatitis B if:  You were born in a country where hepatitis B is common. Ask your health care provider which countries are considered high risk.  Your parents were born in a high-risk country, and you have not been immunized against hepatitis B (hepatitis B vaccine).  You have HIV or AIDS.  You use needles to inject street drugs.  You live with someone who has hepatitis B.  You have had sex with someone who has hepatitis B.  You get hemodialysis treatment.  You take certain medicines for conditions, including cancer, organ transplantation, and autoimmune conditions. Hepatitis C  Blood testing is recommended for:  Everyone born from [birth year] through 1966.  Anyone with known risk factors for hepatitis C. Sexually transmitted infections (STIs)  You should be screened for sexually transmitted infections (STIs) including gonorrhea and chlamydia if:  You are sexually active and are younger than 75 years of age.  You are older than 75 years of age and your health care provider tells you that you are at risk for this type of infection.  Your sexual activity has changed since you were last screened and you are at an increased risk for chlamydia or gonorrhea. Ask your health care provider if you are at risk.  If you do not have HIV, but are at risk, it may be recommended that you take a prescription medicine daily to prevent HIV infection. This is called pre-exposure prophylaxis (PrEP). You are considered at risk if:  You are sexually active and do not regularly use condoms or know the HIV status of your partner(s).  You take drugs by injection.  You are  sexually active with a partner who has HIV. Talk with your health care provider about whether you are at high risk of being infected with HIV. If you choose to begin PrEP, you should first be tested for HIV. You should then be tested every 3 months for as long as you  are taking PrEP.  PREGNANCY   If you are premenopausal and you may become pregnant, ask your health care provider about preconception counseling.  If you may become pregnant, take 400 to 800 micrograms (mcg) of folic acid every day.  If you want to prevent pregnancy, talk to your health care provider about birth control (contraception). OSTEOPOROSIS AND MENOPAUSE   Osteoporosis is a disease in which the bones lose minerals and strength with aging. This can result in serious bone fractures. Your risk for osteoporosis can be identified using a bone density scan.  If you are 66 years of age or older, or if you are at risk for osteoporosis and fractures, ask your health care provider if you should be screened.  Ask your health care provider whether you should take a calcium or vitamin D supplement to lower your risk for osteoporosis.  Menopause may have certain physical symptoms and risks.  Hormone replacement therapy may reduce some of these symptoms and risks. Talk to your health care provider about whether hormone replacement therapy is right for you.  HOME CARE INSTRUCTIONS   Schedule regular health, dental, and eye exams.  Stay current with your immunizations.   Do not use any tobacco products including cigarettes, chewing tobacco, or electronic cigarettes.  If you are pregnant, do not drink alcohol.  If you are breastfeeding, limit how much and how often you drink alcohol.  Limit alcohol intake to no more than 1 drink per day for nonpregnant women. One drink equals 12 ounces of beer, 5 ounces of wine, or 1 ounces of hard liquor.  Do not use street drugs.  Do not share needles.  Ask your health care provider for  help if you need support or information about quitting drugs.  Tell your health care provider if you often feel depressed.  Tell your health care provider if you have ever been abused or do not feel safe at home. Document Released: 02/07/2011 Document Revised: 12/09/2013 Document Reviewed: 06/26/2013 [facility] Patient Information 2015 [place], [place]. This information is not intended to replace advice given to you by your health care provider. Make sure you discuss any questions you have with your health care provider.

## 2014-10-31 NOTE — Assessment & Plan Note (Signed)
Last LDL was 140 on diet alone jan 2012. , HDL 59, trigs 170  She has no interest in starting medications,   Will repeat in one year.   Lab Results  Component Value Date   CHOL 239* 05/28/2013   HDL 50.70 05/28/2013   LDLCALC 140 08/27/2010   LDLDIRECT 160.4 05/28/2013   TRIG 134.0 05/28/2013   CHOLHDL 5 05/28/2013

## 2014-10-31 NOTE — Assessment & Plan Note (Signed)
End of life discussion was done today.  DNR order  Signed.

## 2014-10-31 NOTE — Assessment & Plan Note (Signed)
Annual Medicare wellness  exam was done as well as a comprehensive physical exam and management of acute and chronic conditions .  During the course of the visit the patient was educated and counseled about appropriate screening and preventive services including : fall prevention , diabetes screening, nutrition counseling, colorectal cancer screening, and recommended immunizations.  Printed recommendations for health maintenance screenings was given. She is not interested in mammography or colon ca screening

## 2014-11-14 ENCOUNTER — Ambulatory Visit (INDEPENDENT_AMBULATORY_CARE_PROVIDER_SITE_OTHER): Payer: Medicare Other | Admitting: Internal Medicine

## 2014-11-14 ENCOUNTER — Encounter: Payer: Self-pay | Admitting: Internal Medicine

## 2014-11-14 VITALS — BP 128/68 | HR 68 | Temp 97.5°F | Resp 14 | Ht 66.0 in | Wt 134.1 lb

## 2014-11-14 DIAGNOSIS — Z789 Other specified health status: Secondary | ICD-10-CM | POA: Diagnosis not present

## 2014-11-14 NOTE — Patient Instructions (Signed)
Thank you for coming in today!  We will see you in a year,  Sooner if needed  Your MOST form should go with you to the hospital if you become sick .

## 2014-11-14 NOTE — Progress Notes (Signed)
Patient ID: Maria Christensen, female   DOB: [birth year], 74 y.o.   MRN: [id number]     Patient Active Problem List   Diagnosis Date Noted  . Do not resuscitate discussion 10/29/2014  . Active advance directive on file 10/29/2014  . Polycythemia, secondary 05/30/2013  . Medicare annual wellness visit, subsequent 05/28/2013  . S/P hysterectomy 05/28/2013  . Right shoulder pain 01/15/2013  . Right hip pain 01/15/2013  . 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 Menopausal hot flushes 05/20/2012  . 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 Hyperlipidemia with target LDL less than 160 11/20/2011  . Screening for colon cancer 11/20/2011  . Glaucoma     Subjective:  CC:   Chief Complaint  Patient presents with  . Follow-up    F/U with discussion of MOST forms, DNR options and POA    HPI:   Maria Christensen is a 74 y.o. female who presents for discussion of  her  end of life  Goals.  Patient has a living will and advance directive ,  A DNR,  And has reviewed the MOST form in detail on her own and returns to discuss it. Patient is in good health,  Has no signs of depression .  She desires no further screening for breast or colon cancer.  .   Past Medical History  Diagnosis Date  . Cerebral hemorrhage 2005  . Cleft palate   . Mitral valve prolapse   . Osteopenia   . Herpes zoster   . Open-angle glaucoma   . Glaucoma     managed b [place],  bilateral    Past Surgical History  Procedure Laterality Date  . Abdominal hysterectomy    . Bunionectomy  2002  . Cleft palate repair         The following portions of the patient's history were reviewed and updated as appropriate: Allergies, current medications, and problem list.    Review of Systems:   Patient denies headache, fevers, malaise, unintentional weight loss, skin rash, eye pain, sinus congestion and sinus pain, sore throat, dysphagia,  hemoptysis , cough, dyspnea, wheezing, chest pain, palpitations, orthopnea, edema, abdominal pain,  nausea, melena, diarrhea, constipation, flank pain, dysuria, hematuria, urinary  Frequency, nocturia, numbness, tingling, seizures,  Focal weakness, Loss of consciousness,  Tremor, insomnia, depression, anxiety, and suicidal ideation.     History   Social History  . Marital Status: Widowed    Spouse Name: N/A  . Number of Children: N/A  . Years of Education: N/A   Occupational History  . retired    Social History Main Topics  . Smoking status: Former Smoker    Quit date: 11/15/1961  . Smokeless tobacco: Never Used  . Alcohol Use: No  . Drug Use: No  . Sexual Activity: Not on file   Other Topics Concern  . Not on file   Social History Narrative   Lives alone    Objective:  Filed Vitals:   11/14/14 1032  BP: 128/68  Pulse: 68  Temp: 97.5 F (36.4 C)  Resp: 14     General appearance: alert, cooperative and appears stated age Psych: affect normal, makes good eye contact. No fidgeting,  Smiles easily.  Denies suicidal thoughts   Assessment and Plan:  Active advance directive on file Reviewed the MOST form in detail with patient today.  She has a comprehensive understanding of the intent conveyed in the form by her choices. copy made for chart.    A total of 25 minutes of face  to face time was spent with patient more than half of which was spent in counselling about the above mentioned issue.  Updated Medication List Outpatient Encounter Prescriptions as of 11/14/2014  Medication Sig  . ALPHAGAN P 0.1 % SOLN Place 1 drop into both eyes 2 (two) times daily.   . Calcium-Magnesium-Vitamin D (CITRACAL CALCIUM+D) [id number] MG-MG-UNIT TB24 Take by mouth.  . dorzolamide-timolol (COSOPT) 22.3-6.8 MG/ML ophthalmic solution 1 drop 2 (two) times daily.  Marland Kitchen LUMIGAN 0.01 % SOLN Place 1 drop into both eyes at bedtime.   . multivitamin-lutein (OCUVITE-LUTEIN) CAPS Take 1 capsule by mouth daily.  . [DISCONTINUED] cyclobenzaprine (FLEXERIL) 5 MG tablet Take 1 tablet (5 mg total) by  mouth 3 (three) times daily as needed for muscle spasms. (Patient not taking: Reported on 10/29/2014)  . [DISCONTINUED] fish oil-omega-3 fatty acids 1000 MG capsule Take 2 g by mouth daily.     No orders of the defined types were placed in this encounter.    No Follow-up on file.

## 2014-11-14 NOTE — Progress Notes (Signed)
Pre visit review using our clinic review tool, if applicable. No additional management support is needed unless otherwise documented below in the visit note. 

## 2014-11-16 ENCOUNTER — Encounter: Payer: Self-pay | Admitting: Internal Medicine

## 2014-11-16 NOTE — Assessment & Plan Note (Signed)
Reviewed the MOST form in detail with patient today.  She has a comprehensive understanding of the intent conveyed in the form by her choices. copy made for chart.

## 2014-11-17 IMAGING — CR DG SHOULDER 2+V*R*
4 series · 4 of 4 positions shown · non-contrast
Comparison: None.

CLINICAL DATA: Right shoulder pain radiating to the right arm.

RIGHT SHOULDER - 2+ VIEW

[view not recorded (1 of 4)]
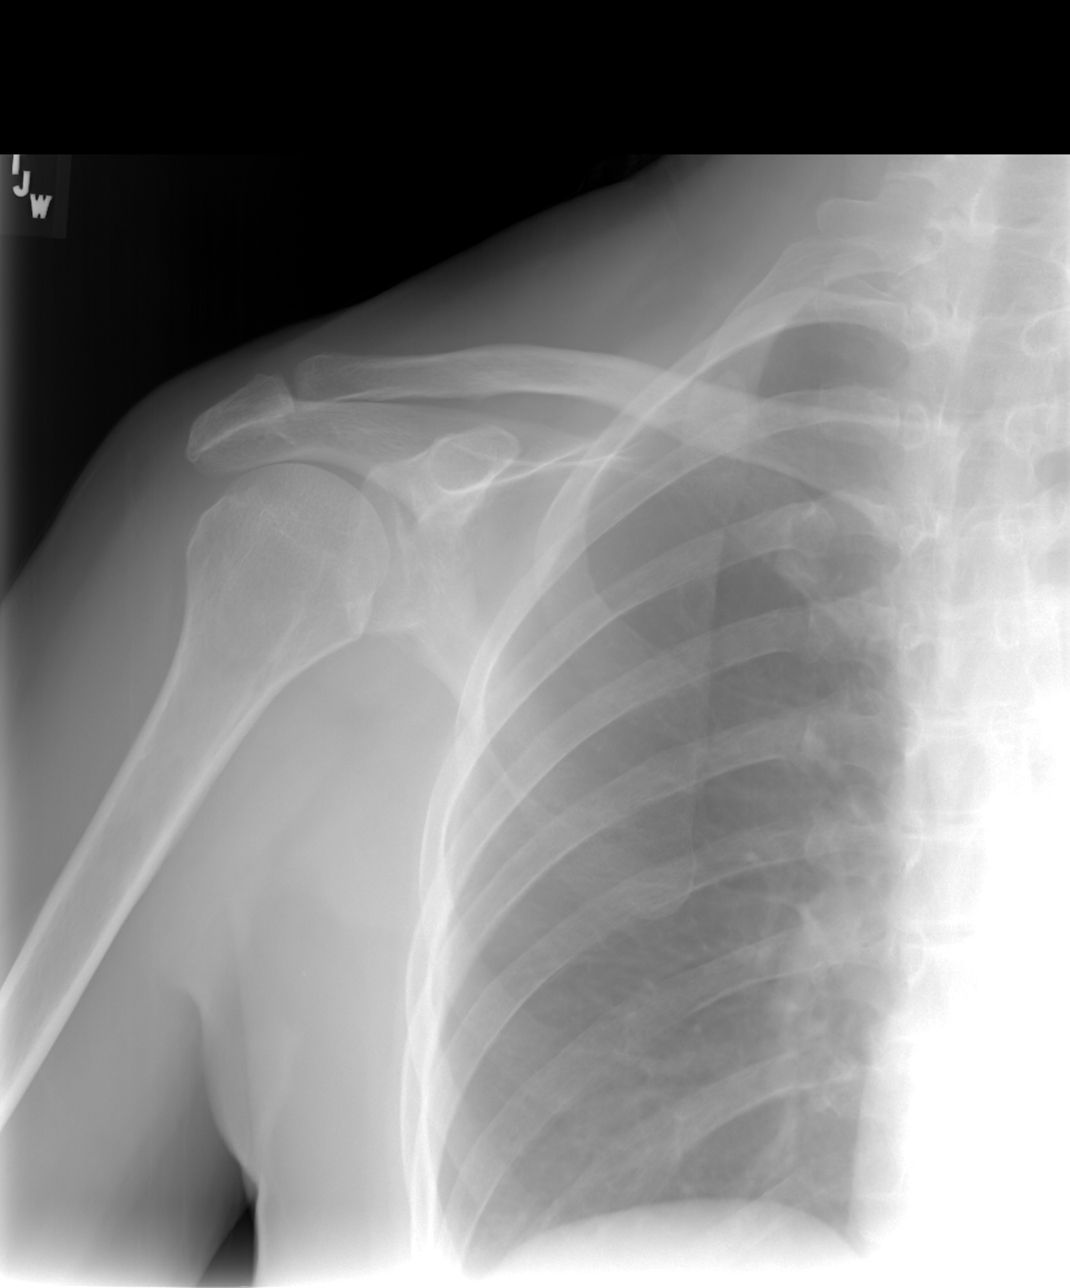

[view not recorded (2 of 4)]
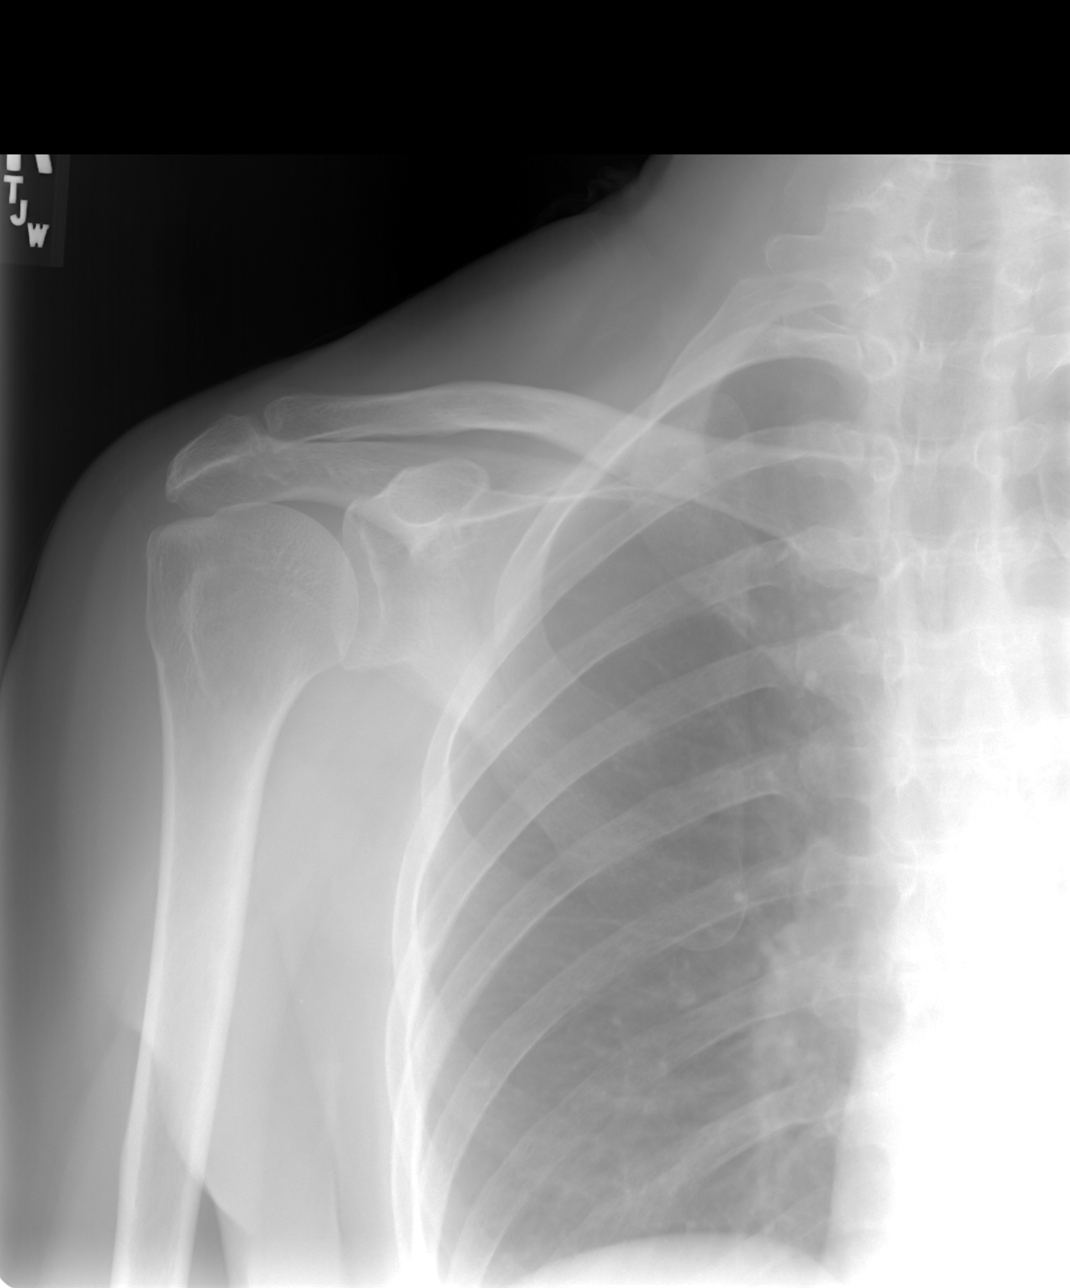

[view not recorded (3 of 4)]
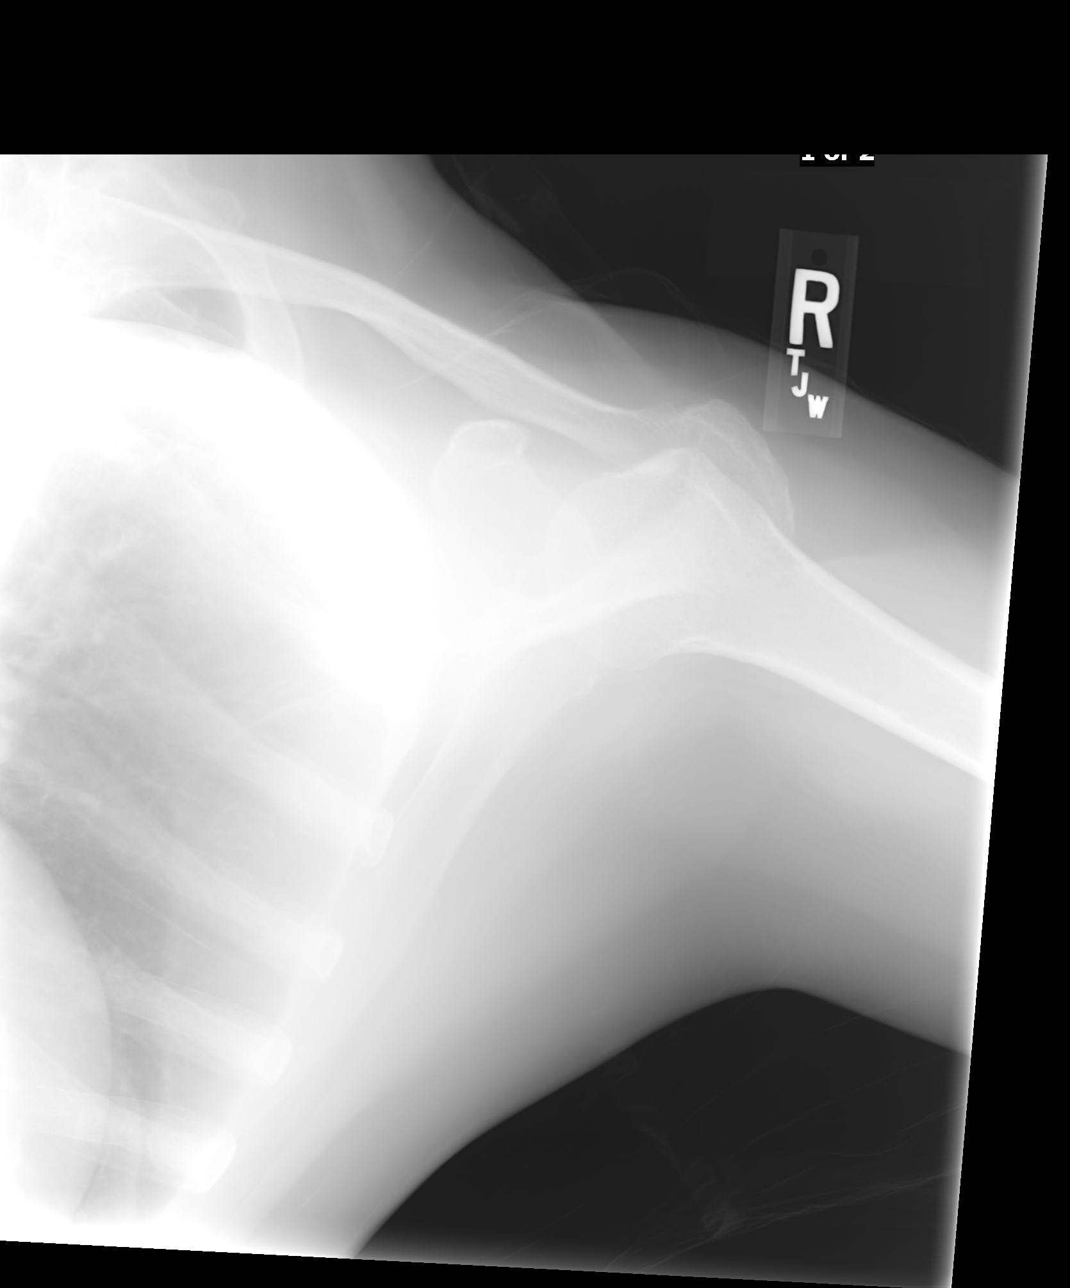

[view not recorded (4 of 4)]
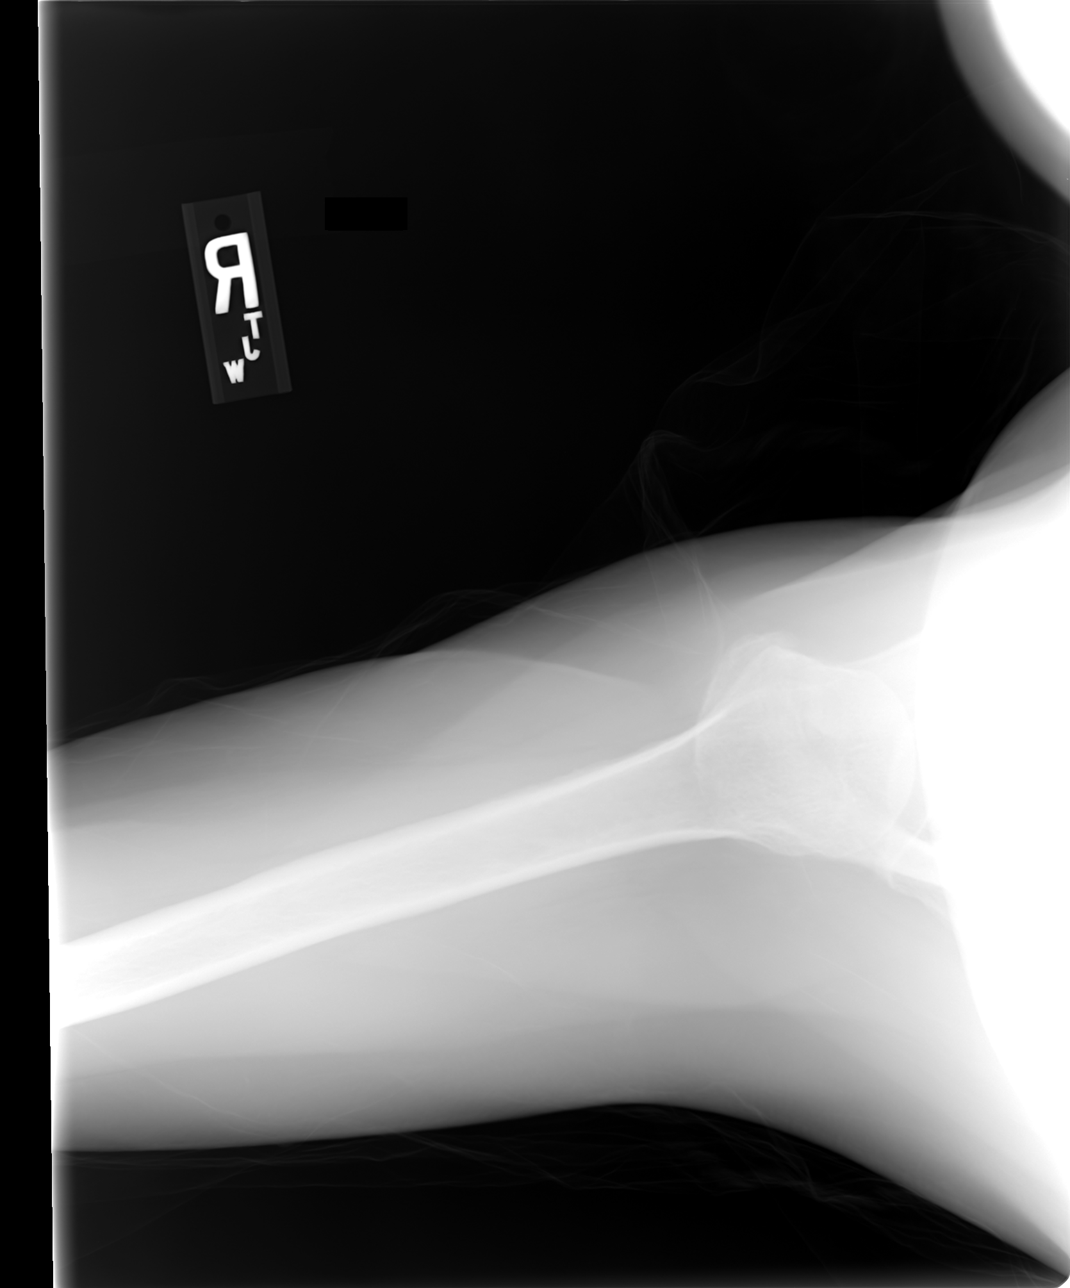

[4 of 4 positions shown; findings below may reference images not displayed]

FINDINGS: No fracture, dislocation, or acute bony abnormality
observed.
IMPRESSION: 1.  No significant abnormality identified.

## 2015-03-30 NOTE — Discharge Instructions (Signed)

## 2015-04-01 ENCOUNTER — Ambulatory Visit: Payer: Medicare Other | Admitting: Anesthesiology

## 2015-04-01 ENCOUNTER — Encounter: Payer: Self-pay | Admitting: Anesthesiology

## 2015-04-01 ENCOUNTER — Ambulatory Visit
Admission: RE | Admit: 2015-04-01 | Discharge: 2015-04-01 | Disposition: A | Discharge status: Home / Self Care | Payer: Medicare Other | Source: Ambulatory Visit | Attending: Ophthalmology | Admitting: Ophthalmology

## 2015-04-01 ENCOUNTER — Encounter
Admission: RE | Disposition: A | Discharge status: Home / Self Care | Payer: Self-pay | Source: Ambulatory Visit | Attending: Ophthalmology

## 2015-04-01 DIAGNOSIS — H9190 Unspecified hearing loss, unspecified ear: Secondary | ICD-10-CM | POA: Insufficient documentation

## 2015-04-01 DIAGNOSIS — Z8679 Personal history of other diseases of the circulatory system: Secondary | ICD-10-CM | POA: Diagnosis not present

## 2015-04-01 DIAGNOSIS — H2512 Age-related nuclear cataract, left eye: Secondary | ICD-10-CM | POA: Insufficient documentation

## 2015-04-01 DIAGNOSIS — Z90710 Acquired absence of both cervix and uterus: Secondary | ICD-10-CM | POA: Insufficient documentation

## 2015-04-01 DIAGNOSIS — M810 Age-related osteoporosis without current pathological fracture: Secondary | ICD-10-CM | POA: Diagnosis not present

## 2015-04-01 DIAGNOSIS — Z79899 Other long term (current) drug therapy: Secondary | ICD-10-CM | POA: Insufficient documentation

## 2015-04-01 DIAGNOSIS — Z87891 Personal history of nicotine dependence: Secondary | ICD-10-CM | POA: Insufficient documentation

## 2015-04-01 HISTORY — PX: CATARACT EXTRACTION W/PHACO: SHX586

## 2015-04-01 SURGERY — PHACOEMULSIFICATION, CATARACT, WITH IOL INSERTION
Anesthesia: Monitor Anesthesia Care | Laterality: Left | Wound class: Clean

## 2015-04-01 MED ORDER — OXYCODONE HCL 5 MG PO TABS: 5.0000 mg | ORAL_TABLET | Freq: Once | ORAL | Status: DC | PRN

## 2015-04-01 MED ORDER — ONDANSETRON HCL 4 MG/2ML IJ SOLN
INTRAMUSCULAR | Status: DC | PRN
  Administered 2015-04-01: 4 mg via INTRAVENOUS

## 2015-04-01 MED ORDER — OXYCODONE HCL 5 MG/5ML PO SOLN: 5.0000 mg | Freq: Once | ORAL | Status: DC | PRN

## 2015-04-01 MED ORDER — BRIMONIDINE TARTRATE 0.2 % OP SOLN
OPHTHALMIC | Status: DC | PRN
  Administered 2015-04-01: 1 [drp]

## 2015-04-01 MED ORDER — POVIDONE-IODINE 5 % OP SOLN
1.0000 | OPHTHALMIC | Status: DC | PRN
Start: 2015-04-01 — End: 2015-04-01
  Administered 2015-04-01: 1 via OPHTHALMIC

## 2015-04-01 MED ORDER — FENTANYL CITRATE (PF) 100 MCG/2ML IJ SOLN
INTRAMUSCULAR | Status: DC | PRN
  Administered 2015-04-01: 50 ug via INTRAVENOUS

## 2015-04-01 MED ORDER — EPINEPHRINE HCL 1 MG/ML IJ SOLN
INTRAOCULAR | Status: DC | PRN
  Administered 2015-04-01: 84 mL via OPHTHALMIC

## 2015-04-01 MED ORDER — MIDAZOLAM HCL 2 MG/2ML IJ SOLN
INTRAMUSCULAR | Status: DC | PRN
  Administered 2015-04-01: 1 mg via INTRAVENOUS

## 2015-04-01 MED ORDER — NA HYALUR & NA CHOND-NA HYALUR 0.4-0.35 ML IO KIT
PACK | INTRAOCULAR | Status: DC | PRN
  Administered 2015-04-01: 1 mL via INTRAOCULAR

## 2015-04-01 MED ORDER — CEFUROXIME OPHTHALMIC INJECTION 1 MG/0.1 ML
INJECTION | OPHTHALMIC | Status: DC | PRN
  Administered 2015-04-01: 0.1 mL via INTRACAMERAL

## 2015-04-01 MED ORDER — TETRACAINE HCL 0.5 % OP SOLN
1.0000 [drp] | OPHTHALMIC | Status: DC | PRN
Start: 2015-04-01 — End: 2015-04-01
  Administered 2015-04-01: 1 [drp] via OPHTHALMIC

## 2015-04-01 MED ORDER — TIMOLOL MALEATE 0.5 % OP SOLN
OPHTHALMIC | Status: DC | PRN
  Administered 2015-04-01: 1 [drp]

## 2015-04-01 MED ORDER — LACTATED RINGERS IV SOLN: INTRAVENOUS | Status: DC

## 2015-04-01 MED ORDER — ARMC OPHTHALMIC DILATING GEL
1.0000 "application " | OPHTHALMIC | Status: DC | PRN
  Administered 2015-04-01 (×2): 1 via OPHTHALMIC

## 2015-04-01 SURGICAL SUPPLY — 28 items
CANNULA ANT/CHMB 27G (MISCELLANEOUS) ×1 IMPLANT
CANNULA ANT/CHMB 27GA (MISCELLANEOUS) ×3 IMPLANT
GLOVE SURG LX 7.5 STRW (GLOVE) ×2
GLOVE SURG LX STRL 7.5 STRW (GLOVE) ×1 IMPLANT
GLOVE SURG TRIUMPH 8.0 PF LTX (GLOVE) ×3 IMPLANT
GOWN STRL REUS W/ TWL LRG LVL3 (GOWN DISPOSABLE) ×2 IMPLANT
GOWN STRL REUS W/TWL LRG LVL3 (GOWN DISPOSABLE) ×6
LENS IOL TECNIS 20.5 (Intraocular Lens) ×3 IMPLANT
LENS IOL TECNIS MONO 1P 20.5 (Intraocular Lens) IMPLANT
MARKER SKIN SURG W/RULER VIO (MISCELLANEOUS) ×3 IMPLANT
NDL FILTER BLUNT 18X1 1/2 (NEEDLE) ×1 IMPLANT
NDL RETROBULBAR .5 NSTRL (NEEDLE) IMPLANT
NEEDLE FILTER BLUNT 18X 1/2SAF (NEEDLE) ×4
NEEDLE FILTER BLUNT 18X1 1/2 (NEEDLE) ×2 IMPLANT
PACK CATARACT BRASINGTON (MISCELLANEOUS) ×3 IMPLANT
PACK EYE AFTER SURG (MISCELLANEOUS) ×3 IMPLANT
PACK OPTHALMIC (MISCELLANEOUS) ×3 IMPLANT
RING MALYGIN 7.0 (MISCELLANEOUS) IMPLANT
SUT ETHILON 10-0 CS-B-6CS-B-6 (SUTURE)
SUT VICRYL  9 0 (SUTURE)
SUT VICRYL 9 0 (SUTURE) IMPLANT
SUTURE EHLN 10-0 CS-B-6CS-B-6 (SUTURE) IMPLANT
SYR 3ML LL SCALE MARK (SYRINGE) ×5 IMPLANT
SYR 5ML LL (SYRINGE) IMPLANT
SYR TB 1ML LUER SLIP (SYRINGE) ×3 IMPLANT
WATER STERILE IRR 250ML POUR (IV SOLUTION) ×3 IMPLANT
WATER STERILE IRR 500ML POUR (IV SOLUTION) IMPLANT
WIPE NON LINTING 3.25X3.25 (MISCELLANEOUS) ×3 IMPLANT

## 2015-04-01 NOTE — Op Note (Signed)
OPERATIVE NOTE  Maria Christensen [id number] 04/01/2015   PREOPERATIVE DIAGNOSIS:  Nuclear sclerotic cataract left eye. H25.12   POSTOPERATIVE DIAGNOSIS:    Nuclear sclerotic cataract left eye.     PROCEDURE:  Phacoemusification with posterior chamber intraocular lens placement of the left eye   LENS:   Implant Name Type Inv. Item Serial No. Manufacturer Lot No. LRB No. Used  LENS IMPL INTRAOC [id number] 20.5 - [id number] Intraocular Lens LENS IMPL INTRAOC [id number] 20.5 [id number] AMO   Left 1        ULTRASOUND TIME: 7  % of 1 minutes 9 seconds, CDE 5.1  SURGEON:  Wyonia Hough, MD   ANESTHESIA:  Topical with tetracaine drops and 2% Xylocaine jelly.   COMPLICATIONS:  None.   DESCRIPTION OF PROCEDURE:  The patient was identified in the holding room and transported to the operating room and placed in the supine position under the operating microscope.  The left eye was identified as the operative eye and it was prepped and draped in the usual sterile ophthalmic fashion.   A 1 millimeter clear-corneal paracentesis was made at the 1:30 position.  The anterior chamber was filled with Viscoat viscoelastic.  A 2.4 millimeter keratome was used to make a near-clear corneal incision at the 10:30 position.  .  A curvilinear capsulorrhexis was made with a cystotome and capsulorrhexis forceps.  Balanced salt solution was used to hydrodissect and hydrodelineate the nucleus.   Phacoemulsification was then used in stop and chop fashion to remove the lens nucleus and epinucleus.  The remaining cortex was then removed using the irrigation and aspiration handpiece. Provisc was then placed into the capsular bag to distend it for lens placement.  A lens was then injected into the capsular bag.  The remaining viscoelastic was aspirated.   Wounds were hydrated with balanced salt solution.  The anterior chamber was inflated to a physiologic pressure with balanced salt solution.  No wound leaks were noted.  Cefuroxime 0.1 ml of a 10mg /ml solution was injected into the anterior chamber for a dose of 1 mg of intracameral antibiotic at the completion of the case.   Timolol and Brimonidine drops were applied to the eye.  The patient was taken to the recovery room in stable condition without complications of anesthesia or surgery.  [name] 04/01/2015, 11:32 AM

## 2015-04-01 NOTE — Anesthesia Procedure Notes (Signed)
Procedure Name: MAC Performed by: Elaya Droege Pre-anesthesia Checklist: Patient identified, Emergency Drugs available, Suction available, Timeout performed and Patient being monitored Patient Re-evaluated:Patient Re-evaluated prior to inductionOxygen Delivery Method: Nasal cannula Placement Confirmation: positive ETCO2       

## 2015-04-01 NOTE — H&P (Signed)
  The History and Physical notes were scanned in.  The patient remains stable and unchanged from the H&P.   Previous H&P reviewed, patient examined, and there are no changes.  Maria Christensen 04/01/2015 9:59 AM \

## 2015-04-01 NOTE — Transfer of Care (Signed)
Immediate Anesthesia Transfer of Care Note  Patient: Maria Christensen  Procedure(s) Performed: Procedure(s): CATARACT EXTRACTION PHACO AND INTRAOCULAR LENS PLACEMENT (IOC) (Left)  Patient Location: PACU  Anesthesia Type: MAC  Level of Consciousness: awake, alert  and patient cooperative  Airway and Oxygen Therapy: Patient Spontanous Breathing and Patient connected to supplemental oxygen  Post-op Assessment: Post-op Vital signs reviewed, Patient's Cardiovascular Status Stable, Respiratory Function Stable, Patent Airway and No signs of Nausea or vomiting  Post-op Vital Signs: Reviewed and stable  Complications: No apparent anesthesia complications

## 2015-04-01 NOTE — Anesthesia Postprocedure Evaluation (Signed)
  Anesthesia Post-op Note  Patient: Maria Christensen  Procedure(s) Performed: Procedure(s): CATARACT EXTRACTION PHACO AND INTRAOCULAR LENS PLACEMENT (IOC) (Left)  Anesthesia type:MAC  Patient location: PACU  Post pain: Pain level controlled  Post assessment: Post-op Vital signs reviewed, Patient's Cardiovascular Status Stable, Respiratory Function Stable, Patent Airway and No signs of Nausea or vomiting  Post vital signs: Reviewed and stable  Last Vitals:  Filed Vitals:   04/01/15 1135  BP: 121/59  Pulse: 69  Temp: 36.5 C  Resp: 16    Level of consciousness: awake, alert  and patient cooperative  Complications: No apparent anesthesia complications

## 2015-04-01 NOTE — Anesthesia Preprocedure Evaluation (Signed)
Anesthesia Evaluation  Patient identified by MRN, date of birth, ID band  Reviewed: NPO status   History of Anesthesia Complications (+) PONV and history of anesthetic complications  Airway Mallampati: III  TM Distance: >3 FB Neck ROM: full    Dental no notable dental hx.    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    Pulmonary exam normal       Cardiovascular Exercise Tolerance: Good negative cardio ROS Normal cardiovascular exam    Neuro/Psych Glaucoma;  Cerebral hemorrhage in 2005   negative psych ROS   GI/Hepatic negative GI ROS, Neg liver ROS,   Endo/Other  negative endocrine ROS  Renal/GU negative Renal ROS  negative genitourinary   Musculoskeletal   Abdominal   Peds  Hematology negative hematology ROS (+)   Anesthesia Other Findings   Reproductive/Obstetrics                             Anesthesia Physical Anesthesia Plan  ASA: II  Anesthesia Plan: MAC   Post-op Pain Management:    Induction:   Airway Management Planned:   Additional Equipment:   Intra-op Plan:   Post-operative Plan:   Informed Consent: I have reviewed the patients History and Physical, chart, labs and discussed the procedure including the risks, benefits and alternatives for the proposed anesthesia with the patient or authorized representative who has indicated his/her understanding and acceptance.     Plan Discussed with: CRNA  Anesthesia Plan Comments:         Anesthesia Quick Evaluation

## 2015-04-02 ENCOUNTER — Encounter: Payer: Self-pay | Admitting: Ophthalmology

## 2015-05-26 ENCOUNTER — Ambulatory Visit (INDEPENDENT_AMBULATORY_CARE_PROVIDER_SITE_OTHER): Payer: Medicare Other

## 2015-05-26 DIAGNOSIS — Z23 Encounter for immunization: Secondary | ICD-10-CM

## 2015-09-24 ENCOUNTER — Ambulatory Visit (INDEPENDENT_AMBULATORY_CARE_PROVIDER_SITE_OTHER): Payer: Medicare Other | Admitting: Family Medicine

## 2015-09-24 ENCOUNTER — Encounter: Payer: Self-pay | Admitting: Family Medicine

## 2015-09-24 VITALS — BP 106/68 | HR 87 | Temp 99.2°F | Ht 66.0 in | Wt 139.4 lb

## 2015-09-24 DIAGNOSIS — J069 Acute upper respiratory infection, unspecified: Secondary | ICD-10-CM | POA: Diagnosis not present

## 2015-09-24 DIAGNOSIS — B9789 Other viral agents as the cause of diseases classified elsewhere: Principal | ICD-10-CM

## 2015-09-24 MED ORDER — PREDNISONE 50 MG PO TABS: ORAL_TABLET | ORAL | Status: DC

## 2015-09-24 MED ORDER — HYDROCOD POLST-CPM POLST ER 10-8 MG/5ML PO SUER: 5.0000 mL | Freq: Two times a day (BID) | ORAL | Status: DC | PRN

## 2015-09-24 NOTE — Patient Instructions (Signed)
Take the prednisone and use the cough syrup as needed.  This is likely viral and will resolve slowly.  Take care  Dr. Lacinda Axon   Upper Respiratory Infection, Adult Most upper respiratory infections (URIs) are a viral infection of the air passages leading to the lungs. A URI affects the nose, throat, and upper air passages. The most common type of URI is nasopharyngitis and is typically referred to as "the common cold." URIs run their course and usually go away on their own. Most of the time, a URI does not require medical attention, but sometimes a bacterial infection in the upper airways can follow a viral infection. This is called a secondary infection. Sinus and middle ear infections are common types of secondary upper respiratory infections. Bacterial pneumonia can also complicate a URI. A URI can worsen asthma and chronic obstructive pulmonary disease (COPD). Sometimes, these complications can require emergency medical care and may be life threatening.  CAUSES Almost all URIs are caused by viruses. A virus is a type of germ and can spread from one person to another.  RISKS FACTORS You may be at risk for a URI if:   You smoke.   You have chronic heart or lung disease.  You have a weakened defense (immune) system.   You are very young or very old.   You have nasal allergies or asthma.  You work in crowded or poorly ventilated areas.  You work in health care facilities or schools. SIGNS AND SYMPTOMS  Symptoms typically develop 2-3 days after you come in contact with a cold virus. Most viral URIs last 7-10 days. However, viral URIs from the influenza virus (flu virus) can last 14-18 days and are typically more severe. Symptoms may include:   Runny or stuffy (congested) nose.   Sneezing.   Cough.   Sore throat.   Headache.   Fatigue.   Fever.   Loss of appetite.   Pain in your forehead, behind your eyes, and over your cheekbones (sinus pain).  Muscle aches.   DIAGNOSIS  Your health care provider may diagnose a URI by:  Physical exam.  Tests to check that your symptoms are not due to another condition such as:  Strep throat.  Sinusitis.  Pneumonia.  Asthma. TREATMENT  A URI goes away on its own with time. It cannot be cured with medicines, but medicines may be prescribed or recommended to relieve symptoms. Medicines may help:  Reduce your fever.  Reduce your cough.  Relieve nasal congestion. HOME CARE INSTRUCTIONS   Take medicines only as directed by your health care provider.   Gargle warm saltwater or take cough drops to comfort your throat as directed by your health care provider.  Use a warm mist humidifier or inhale steam from a shower to increase air moisture. This may make it easier to breathe.  Drink enough fluid to keep your urine clear or pale yellow.   Eat soups and other clear broths and maintain good nutrition.   Rest as needed.   Return to work when your temperature has returned to normal or as your health care provider advises. You may need to stay home longer to avoid infecting others. You can also use a face mask and careful hand washing to prevent spread of the virus.  Increase the usage of your inhaler if you have asthma.   Do not use any tobacco products, including cigarettes, chewing tobacco, or electronic cigarettes. If you need help quitting, ask your health care provider. PREVENTION  The best way to protect yourself from getting a cold is to practice good hygiene.   Avoid oral or hand contact with people with cold symptoms.   Wash your hands often if contact occurs.  There is no clear evidence that vitamin C, vitamin E, echinacea, or exercise reduces the chance of developing a cold. However, it is always recommended to get plenty of rest, exercise, and practice good nutrition.  SEEK MEDICAL CARE IF:   You are getting worse rather than better.   Your symptoms are not controlled by  medicine.   You have chills.  You have worsening shortness of breath.  You have brown or red mucus.  You have yellow or brown nasal discharge.  You have pain in your face, especially when you bend forward.  You have a fever.  You have swollen neck glands.  You have pain while swallowing.  You have white areas in the back of your throat. SEEK IMMEDIATE MEDICAL CARE IF:   You have severe or persistent:  Headache.  Ear pain.  Sinus pain.  Chest pain.  You have chronic lung disease and any of the following:  Wheezing.  Prolonged cough.  Coughing up blood.  A change in your usual mucus.  You have a stiff neck.  You have changes in your:  Vision.  Hearing.  Thinking.  Mood. MAKE SURE YOU:   Understand these instructions.  Will watch your condition.  Will get help right away if you are not doing well or get worse.   This information is not intended to replace advice given to you by your health care provider. Make sure you discuss any questions you have with your health care provider.   Document Released: 01/18/2001 Document Revised: 12/09/2014 Document Reviewed: 10/30/2013 Elsevier Interactive Patient Education [employer].

## 2015-09-24 NOTE — Assessment & Plan Note (Signed)
New problem.  Likely viral in origin. Treating with prednisone and Tussionex. No indication for antibiotics at this time.

## 2015-09-24 NOTE — Progress Notes (Signed)
Pre visit review using our clinic review tool, if applicable. No additional management support is needed unless otherwise documented below in the visit note. 

## 2015-09-24 NOTE — Progress Notes (Signed)
Subjective:  Patient ID: Maria Christensen, female    DOB: 01-27-1941  Age: 75 y.o. MRN: [id number]  CC: Nasal congestion, cough  HPI:  75 year old female presents with the above complaints.  Nasal congestion/cough  Patient reports that she's not been feeling well since Tuesday.  She's been experiencing significant nasal congestion and cough.  She states she's blowing clear mucus out of her nose. Cough is nonproductive.  No associated fever. She does report chills.  She also reports decreased appetite.  No other complaints.  No exacerbating or relieving factors.  Social Hx   Social History   Social History  . Marital Status: Widowed    Spouse Name: N/A  . Number of Children: N/A  . Years of Education: N/A   Occupational History  . retired    Social History Main Topics  . Smoking status: Former Smoker    Quit date: 11/15/1961  . Smokeless tobacco: Never Used  . Alcohol Use: No  . Drug Use: No  . Sexual Activity: Not Asked   Other Topics Concern  . None   Social History Narrative   Lives alone   Review of Systems  Constitutional: Positive for chills. Negative for fever.  HENT: Positive for congestion.   Respiratory: Positive for cough.    Objective:  BP 106/68 mmHg  Pulse 87  Temp(Src) 99.2 F (37.3 C) (Oral)  Ht 5\' 6"  (1.676 m)  Wt 139 lb 6 oz (63.22 kg)  BMI 22.51 kg/m2  SpO2 98%  BP/Weight 09/24/2015 Q000111Q [id number]  Systolic BP [id number] [id number]  Diastolic BP 68 72 68  Wt. (Lbs) 139.38 138 134.12  BMI 22.51 22.96 21.66   Physical Exam  Constitutional: She is oriented to person, place, and time. She appears well-developed. No distress.  HENT:  Head: Normocephalic and atraumatic.  Mouth/Throat: Oropharynx is clear and moist.  R TM without erythema/bulging. Hole/perforation noted.   Cardiovascular: Normal rate and regular rhythm.   Pulmonary/Chest: Effort normal and breath sounds normal. No respiratory distress. She has no wheezes. She has  no rales.  Neurological: She is alert and oriented to person, place, and time.  Psychiatric: She has a normal mood and affect.  Vitals reviewed.  Lab Results  Component Value Date   WBC 6.9 05/28/2013   HGB 15.3* 05/28/2013   HCT 44.1 05/28/2013   PLT 198.0 05/28/2013   GLUCOSE 105* 05/28/2013   CHOL 239* 05/28/2013   TRIG 134.0 05/28/2013   HDL 50.70 05/28/2013   LDLDIRECT 160.4 05/28/2013   LDLCALC 140 08/27/2010   ALT 16 05/28/2013   AST 23 05/28/2013   NA 144 05/28/2013   K 5.0 05/28/2013   CL 108 05/28/2013   CREATININE 1.1 05/28/2013   BUN 13 05/28/2013   CO2 27 05/28/2013   TSH 0.97 05/28/2013   Assessment & Plan:   Problem List Items Addressed This Visit    Viral URI with cough - Primary    New problem.  Likely viral in origin. Treating with prednisone and Tussionex. No indication for antibiotics at this time.         Meds ordered this encounter  Medications  . predniSONE (DELTASONE) 50 MG tablet    Sig: 1 tablet daily x 5 days.    Dispense:  5 tablet    Refill:  0  . chlorpheniramine-HYDROcodone (TUSSIONEX PENNKINETIC ER) 10-8 MG/5ML SUER    Sig: Take 5 mLs by mouth every 12 (twelve) hours as needed.    Dispense:  115 mL    Refill:  0   Follow-up: PRN  [place]

## 2015-11-10 ENCOUNTER — Telehealth: Payer: Self-pay | Admitting: *Deleted

## 2015-11-10 ENCOUNTER — Other Ambulatory Visit (INDEPENDENT_AMBULATORY_CARE_PROVIDER_SITE_OTHER): Payer: Medicare Other

## 2015-11-10 DIAGNOSIS — E785 Hyperlipidemia, unspecified: Secondary | ICD-10-CM

## 2015-11-10 DIAGNOSIS — Z7289 Other problems related to lifestyle: Secondary | ICD-10-CM

## 2015-11-10 DIAGNOSIS — M25551 Pain in right hip: Secondary | ICD-10-CM

## 2015-11-10 DIAGNOSIS — D751 Secondary polycythemia: Secondary | ICD-10-CM

## 2015-11-10 DIAGNOSIS — E559 Vitamin D deficiency, unspecified: Secondary | ICD-10-CM | POA: Diagnosis not present

## 2015-11-10 LAB — CBC WITH DIFFERENTIAL/PLATELET
BASOS PCT: 0.6 % (ref 0.0–3.0)
Basophils Absolute: 0 10*3/uL (ref 0.0–0.1)
EOS ABS: 0.2 10*3/uL (ref 0.0–0.7)
Eosinophils Relative: 3 % (ref 0.0–5.0)
HCT: 41.6 % (ref 36.0–46.0)
HEMOGLOBIN: 13.9 g/dL (ref 12.0–15.0)
LYMPHS ABS: 1.5 10*3/uL (ref 0.7–4.0)
Lymphocytes Relative: 25 % (ref 12.0–46.0)
MCHC: 33.4 g/dL (ref 30.0–36.0)
MCV: 90.5 fl (ref 78.0–100.0)
MONO ABS: 0.5 10*3/uL (ref 0.1–1.0)
Monocytes Relative: 7.6 % (ref 3.0–12.0)
NEUTROS PCT: 63.8 % (ref 43.0–77.0)
Neutro Abs: 3.9 10*3/uL (ref 1.4–7.7)
PLATELETS: 181 10*3/uL (ref 150.0–400.0)
RBC: 4.59 Mil/uL (ref 3.87–5.11)
RDW: 14 % (ref 11.5–15.5)
WBC: 6.1 10*3/uL (ref 4.0–10.5)

## 2015-11-10 LAB — LIPID PANEL
CHOLESTEROL: 200 mg/dL (ref 0–200)
HDL: 44.9 mg/dL (ref >39.00)
LDL Cholesterol: 116 mg/dL — ABNORMAL HIGH (ref 0–99)
NONHDL: 155.19
Total CHOL/HDL Ratio: 4
Triglycerides: 198 mg/dL — ABNORMAL HIGH (ref 0.0–149.0)
VLDL: 39.6 mg/dL (ref 0.0–40.0)

## 2015-11-10 LAB — COMPREHENSIVE METABOLIC PANEL
ALBUMIN: 3.9 g/dL (ref 3.5–5.2)
ALT: 14 U/L (ref 0–35)
AST: 16 U/L (ref 0–37)
Alkaline Phosphatase: 56 U/L (ref 39–117)
BUN: 9 mg/dL (ref 6–23)
CHLORIDE: 112 meq/L (ref 96–112)
CO2: 27 mEq/L (ref 19–32)
CREATININE: 0.96 mg/dL (ref 0.40–1.20)
Calcium: 9.5 mg/dL (ref 8.4–10.5)
GFR: 60.11 mL/min (ref >60.00)
GLUCOSE: 106 mg/dL — AB (ref 70–99)
Potassium: 4.7 mEq/L (ref 3.5–5.1)
SODIUM: 145 meq/L (ref 135–145)
TOTAL PROTEIN: 5.9 g/dL — AB (ref 6.0–8.3)
Total Bilirubin: 1 mg/dL (ref 0.2–1.2)

## 2015-11-10 LAB — VITAMIN D 25 HYDROXY (VIT D DEFICIENCY, FRACTURES): VITD: 43.36 ng/mL (ref 30.00–100.00)

## 2015-11-10 LAB — TSH: TSH: 1.74 u[IU]/mL (ref 0.35–4.50)

## 2015-11-10 NOTE — Telephone Encounter (Signed)
Labs and dx?  

## 2015-11-11 LAB — HIV ANTIBODY (ROUTINE TESTING W REFLEX): HIV 1&2 Ab, 4th Generation: NONREACTIVE

## 2015-11-11 LAB — HEPATITIS C ANTIBODY: HCV AB: NEGATIVE

## 2015-11-12 ENCOUNTER — Ambulatory Visit (INDEPENDENT_AMBULATORY_CARE_PROVIDER_SITE_OTHER): Payer: Medicare Other | Admitting: Internal Medicine

## 2015-11-12 ENCOUNTER — Encounter: Payer: Self-pay | Admitting: Internal Medicine

## 2015-11-12 VITALS — BP 120/70 | HR 67 | Temp 97.6°F | Resp 12 | Ht 64.5 in | Wt 138.5 lb

## 2015-11-12 DIAGNOSIS — Z0000 Encounter for general adult medical examination without abnormal findings: Secondary | ICD-10-CM | POA: Diagnosis not present

## 2015-11-12 DIAGNOSIS — H6122 Impacted cerumen, left ear: Secondary | ICD-10-CM

## 2015-11-12 NOTE — Progress Notes (Signed)
Pre-visit discussion using our clinic review tool. No additional management support is needed unless otherwise documented below in the visit note.  

## 2015-11-12 NOTE — Progress Notes (Signed)
Patient ID: Maria Christensen, female    DOB: 05-20-1941  Age: 75 y.o. MRN: [id number]  The patient is here for annual Medicare wellness examination and management of other chronic and acute problems.  Reviewed her persistent desire to stop screening for colon ca and breast CA Reciwewed DNR orderes     The risk factors are reflected in the social history.  The roster of all physicians providing medical care to patient - is listed in the Snapshot section of the chart.  Activities of daily living:  The patient is 100% independent in all ADLs: dressing, toileting, feeding as well as independent mobility  Home safety : The patient has smoke detectors in the home. They wear seatbelts.  There are no firearms at home. There is no violence in the home.   There is no risks for hepatitis, STDs or HIV. There is no   history of blood transfusion. They have no travel history to infectious disease endemic areas of the world.  The patient has seen their dentist in the last six month. They have seen their eye doctor in the last year. They admit to slight hearing difficulty with regard to whispered voices and some television programs.  They have deferred audiologic testing in the last year.  They do not  have excessive sun exposure. Discussed the need for sun protection: hats, long sleeves and use of sunscreen if there is significant sun exposure.   Diet: the importance of a healthy diet is discussed. They do have a healthy diet.  The benefits of regular aerobic exercise were discussed. She walks 4 times per week ,  20 minutes.   Depression screen: there are no signs or vegative symptoms of depression- irritability, change in appetite, anhedonia, sadness/tearfullness.  Cognitive assessment: the patient manages all their financial and personal affairs and is actively engaged. They could relate day,date,year and events; recalled 2/3 objects at 3 minutes; performed clock-face test normally.  The following  portions of the patient's history were reviewed and updated as appropriate: allergies, current medications, past family history, past medical history,  past surgical history, past social history  and problem list.  Visual acuity was not assessed per patient preference since she has regular follow up with her ophthalmologist. Hearing and body mass index were assessed and reviewed.   During the course of the visit the patient was educated and counseled about appropriate screening and preventive services including : fall prevention , diabetes screening, nutrition counseling, colorectal cancer screening, and recommended immunizations.    CC: The primary encounter diagnosis was Medicare annual wellness visit, subsequent. A diagnosis of Cerumen impaction, left was also pertinent to this visit.  History Maria Christensen has a past medical history of Cerebral hemorrhage ([facility]) (2005); Cleft palate; Mitral valve prolapse; Osteopenia; Herpes zoster; Open-angle glaucoma; and Glaucoma.   She has past surgical history that includes Abdominal hysterectomy; Bunionectomy (2002); Cleft palate repair; and Cataract extraction w/PHACO (Left, 04/01/2015).   Her family history includes Cancer in her father; Mental illness in her mother; Osteoporosis in her mother.She reports that she quit smoking about 54 years ago. She has never used smokeless tobacco. She reports that she does not drink alcohol or use illicit drugs.  Outpatient Prescriptions Prior to Visit  Medication Sig Dispense Refill  . ALPHAGAN P 0.1 % SOLN Place 1 drop into both eyes 2 (two) times daily.     . Calcium-Magnesium-Vitamin D (CITRACAL CALCIUM+D) [id number] MG-MG-UNIT TB24 Take by mouth.    . dorzolamide-timolol (COSOPT) 22.3-6.8 MG/ML ophthalmic  solution Place 1 drop into both eyes 2 (two) times daily.     Marland Kitchen LUMIGAN 0.01 % SOLN Place 1 drop into both eyes at bedtime.     . multivitamin-lutein (OCUVITE-LUTEIN) CAPS Take 1 capsule by mouth daily.  0  .  chlorpheniramine-HYDROcodone (TUSSIONEX PENNKINETIC ER) 10-8 MG/5ML SUER Take 5 mLs by mouth every 12 (twelve) hours as needed. (Patient not taking: Reported on 11/12/2015) 115 mL 0  . predniSONE (DELTASONE) 50 MG tablet 1 tablet daily x 5 days. (Patient not taking: Reported on 11/12/2015) 5 tablet 0   No facility-administered medications prior to visit.    Review of Systems   Patient denies headache, fevers, malaise, unintentional weight loss, skin rash, eye pain, sinus congestion and sinus pain, sore throat, dysphagia,  hemoptysis , cough, dyspnea, wheezing, chest pain, palpitations, orthopnea, edema, abdominal pain, nausea, melena, diarrhea, constipation, flank pain, dysuria, hematuria, urinary  Frequency, nocturia, numbness, tingling, seizures,  Focal weakness, Loss of consciousness,  Tremor,, depression, anxiety, and suicidal ideation.      Objective:  BP 120/70 mmHg  Pulse 67  Temp(Src) 97.6 F (36.4 C) (Oral)  Resp 12  Ht 5' 4.5" (1.638 m)  Wt 138 lb 8 oz (62.823 kg)  BMI 23.41 kg/m2  SpO2 99%  Physical Exam   General appearance: alert, cooperative and appears stated age Head: Normocephalic, without obvious abnormality, atraumatic Eyes: conjunctivae/corneas clear. PERRL, EOM's intact. Fundi benign. Ears: left ear canal with cerumen,  Not occluded,  right TM scarred , no cerumen Nose: Nares normal. Septum midline. Mucosa normal. No drainage or sinus tenderness. Throat: lips, mucosa, and tongue normal;  oruir ckeft palate surgical changes evident, teeth and gums normal Neck: no adenopathy, no carotid bruit, no JVD, supple, symmetrical, trachea midline and thyroid not enlarged, symmetric, no tenderness/mass/nodules Lungs: clear to auscultation bilaterally Breasts: normal appearance, no masses or tenderness Heart: regular rate and rhythm, S1, S2 normal, no murmur, click, rub or gallop Abdomen: soft, non-tender; bowel sounds normal; no masses,  no organomegaly Extremities:  extremities normal, atraumatic, no cyanosis or edema Pulses: 2+ and symmetric Skin: Skin color, texture, turgor normal. No rashes or lesions Neurologic: Alert and oriented X 3, normal strength and tone. Normal symmetric reflexes. Normal coordination and gait.     Assessment & Plan:   Problem List Items Addressed This Visit    Medicare annual wellness visit, subsequent - Primary    Annual Medicare wellness  exam was done as well as a comprehensive physical exam and management of acute and chronic conditions .  During the course of the visit the patient was educated and counseled about appropriate screening and preventive services including : fall prevention , diabetes screening, nutrition counseling, colorectal cancer screening, and recommended immunizations.  Printed recommendations for health maintenance screenings was given.       Cerumen impaction    Use of Debrox advised          I have discontinued Ms. Loy's predniSONE and chlorpheniramine-HYDROcodone. I am also having her maintain her dorzolamide-timolol, Calcium-Magnesium-Vitamin D, multivitamin-lutein, LUMIGAN, and ALPHAGAN P.  No orders of the defined types were placed in this encounter.    Medications Discontinued During This Encounter  Medication Reason  . predniSONE (DELTASONE) 50 MG tablet Error  . chlorpheniramine-HYDROcodone (TUSSIONEX PENNKINETIC ER) 10-8 MG/5ML SUER Error    Follow-up: Return in about 1 year (around 11/11/2016).   Crecencio Mc, MD

## 2015-11-12 NOTE — Patient Instructions (Addendum)
You can use Debrox ear drops in your left ear to soften and remove the light buildup of ear wax   You can use Melatonin every night,  It is not habit forming  If you change your mind about the mammogram, let me know   Menopause is a normal process in which your reproductive ability comes to an end. This process happens gradually over a span of months to years, usually between the ages of 49 and 61. Menopause is complete when you have missed 12 consecutive menstrual periods. It is important to talk with your health care provider about some of the most common conditions that affect postmenopausal women, such as heart disease, cancer, and bone loss (osteoporosis). Adopting a healthy lifestyle and getting preventive care can help to promote your health and wellness. Those actions can also lower your chances of developing some of these common conditions. WHAT SHOULD I KNOW ABOUT MENOPAUSE? During menopause, you may experience a number of symptoms, such as:  Moderate-to-severe hot flashes.  Night sweats.  Decrease in sex drive.  Mood swings.  Headaches.  Tiredness.  Irritability.  Memory problems.  Insomnia. Choosing to treat or not to treat menopausal changes is an individual decision that you make with your health care provider. WHAT SHOULD I KNOW ABOUT HORMONE REPLACEMENT THERAPY AND SUPPLEMENTS? Hormone therapy products are effective for treating symptoms that are associated with menopause, such as hot flashes and night sweats. Hormone replacement carries certain risks, especially as you become older. If you are thinking about using estrogen or estrogen with progestin treatments, discuss the benefits and risks with your health care provider. WHAT SHOULD I KNOW ABOUT HEART DISEASE AND STROKE? Heart disease, heart attack, and stroke become more likely as you age. This may be due, in part, to the hormonal changes that your body experiences during menopause. These can affect how your body  processes dietary fats, triglycerides, and cholesterol. Heart attack and stroke are both medical emergencies. There are many things that you can do to help prevent heart disease and stroke:  Have your blood pressure checked at least every 1-2 years. High blood pressure causes heart disease and increases the risk of stroke.  If you are 57-29 years old, ask your health care provider if you should take aspirin to prevent a heart attack or a stroke.  Do not use any tobacco products, including cigarettes, chewing tobacco, or electronic cigarettes. If you need help quitting, ask your health care provider.  It is important to eat a healthy diet and maintain a healthy weight.  Be sure to include plenty of vegetables, fruits, low-fat dairy products, and lean protein.  Avoid eating foods that are high in solid fats, added sugars, or salt (sodium).  Get regular exercise. This is one of the most important things that you can do for your health.  Try to exercise for at least 150 minutes each week. The type of exercise that you do should increase your heart rate and make you sweat. This is known as moderate-intensity exercise.  Try to do strengthening exercises at least twice each week. Do these in addition to the moderate-intensity exercise.  Know your numbers.Ask your health care provider to check your cholesterol and your blood glucose. Continue to have your blood tested as directed by your health care provider. WHAT SHOULD I KNOW ABOUT CANCER SCREENING? There are several types of cancer. Take the following steps to reduce your risk and to catch any cancer development as early as possible. Breast Cancer  Practice breast self-awareness.  This means understanding how your breasts normally appear and feel.  It also means doing regular breast self-exams. Let your health care provider know about any changes, no matter how small.  If you are 14 or older, have a clinician do a breast exam (clinical  breast exam or CBE) every year. Depending on your age, family history, and medical history, it may be recommended that you also have a yearly breast X-ray (mammogram).  If you have a family history of breast cancer, talk with your health care provider about genetic screening.  If you are at high risk for breast cancer, talk with your health care provider about having an MRI and a mammogram every year.  Breast cancer (BRCA) gene test is recommended for women who have family members with BRCA-related cancers. Results of the assessment will determine the need for genetic counseling and BRCA1 and for BRCA2 testing. BRCA-related cancers include these types:  Breast. This occurs in males or females.  Ovarian.  Tubal. This may also be called fallopian tube cancer.  Cancer of the abdominal or pelvic lining (peritoneal cancer).  Prostate.  Pancreatic. Cervical, Uterine, and Ovarian Cancer Your health care provider may recommend that you be screened regularly for cancer of the pelvic organs. These include your ovaries, uterus, and vagina. This screening involves a pelvic exam, which includes checking for microscopic changes to the surface of your cervix (Pap test).  For women ages 21-65, health care providers may recommend a pelvic exam and a Pap test every three years. For women ages 61-65, they may recommend the Pap test and pelvic exam, combined with testing for human papilloma virus (HPV), every five years. Some types of HPV increase your risk of cervical cancer. Testing for HPV may also be done on women of any age who have unclear Pap test results.  Other health care providers may not recommend any screening for nonpregnant women who are considered low risk for pelvic cancer and have no symptoms. Ask your health care provider if a screening pelvic exam is right for you.  If you have had past treatment for cervical cancer or a condition that could lead to cancer, you need Pap tests and screening  for cancer for at least 20 years after your treatment. If Pap tests have been discontinued for you, your risk factors (such as having a new sexual partner) need to be reassessed to determine if you should start having screenings again. Some women have medical problems that increase the chance of getting cervical cancer. In these cases, your health care provider may recommend that you have screening and Pap tests more often.  If you have a family history of uterine cancer or ovarian cancer, talk with your health care provider about genetic screening.  If you have vaginal bleeding after reaching menopause, tell your health care provider.  There are currently no reliable tests available to screen for ovarian cancer. Lung Cancer Lung cancer screening is recommended for adults 62-39 years old who are at high risk for lung cancer because of a history of smoking. A yearly low-dose CT scan of the lungs is recommended if you:  Currently smoke.  Have a history of at least 30 pack-years of smoking and you currently smoke or have quit within the past 15 years. A pack-year is smoking an average of one pack of cigarettes per day for one year. Yearly screening should:  Continue until it has been 15 years since you quit.  Stop if you  develop a health problem that would prevent you from having lung cancer treatment. Colorectal Cancer  This type of cancer can be detected and can often be prevented.  Routine colorectal cancer screening usually begins at age 59 and continues through age 52.  If you have risk factors for colon cancer, your health care provider may recommend that you be screened at an earlier age.  If you have a family history of colorectal cancer, talk with your health care provider about genetic screening.  Your health care provider may also recommend using home test kits to check for hidden blood in your stool.  A small camera at the end of a tube can be used to examine your colon  directly (sigmoidoscopy or colonoscopy). This is done to check for the earliest forms of colorectal cancer.  Direct examination of the colon should be repeated every 5-10 years until age 52. However, if early forms of precancerous polyps or small growths are found or if you have a family history or genetic risk for colorectal cancer, you may need to be screened more often. Skin Cancer  Check your skin from head to toe regularly.  Monitor any moles. Be sure to tell your health care provider:  About any new moles or changes in moles, especially if there is a change in a mole's shape or color.  If you have a mole that is larger than the size of a pencil eraser.  If any of your family members has a history of skin cancer, especially at a young age, talk with your health care provider about genetic screening.  Always use sunscreen. Apply sunscreen liberally and repeatedly throughout the day.  Whenever you are outside, protect yourself by wearing long sleeves, pants, a wide-brimmed hat, and sunglasses. WHAT SHOULD I KNOW ABOUT OSTEOPOROSIS? Osteoporosis is a condition in which bone destruction happens more quickly than new bone creation. After menopause, you may be at an increased risk for osteoporosis. To help prevent osteoporosis or the bone fractures that can happen because of osteoporosis, the following is recommended:  If you are 69-24 years old, get at least 1,000 mg of calcium and at least 600 mg of vitamin D per day.  If you are older than age 53 but younger than age 29, get at least 1,200 mg of calcium and at least 600 mg of vitamin D per day.  If you are older than age 1, get at least 1,200 mg of calcium and at least 800 mg of vitamin D per day. Smoking and excessive alcohol intake increase the risk of osteoporosis. Eat foods that are rich in calcium and vitamin D, and do weight-bearing exercises several times each week as directed by your health care provider. WHAT SHOULD I KNOW  ABOUT HOW MENOPAUSE AFFECTS [place]? Depression may occur at any age, but it is more common as you become older. Common symptoms of depression include:  Low or sad mood.  Changes in sleep patterns.  Changes in appetite or eating patterns.  Feeling an overall lack of motivation or enjoyment of activities that you previously enjoyed.  Frequent crying spells. Talk with your health care provider if you think that you are experiencing depression. WHAT SHOULD I KNOW ABOUT IMMUNIZATIONS? It is important that you get and maintain your immunizations. These include:  Tetanus, diphtheria, and pertussis (Tdap) booster vaccine.  Influenza every year before the flu season begins.  Pneumonia vaccine.  Shingles vaccine. Your health care provider may also recommend other immunizations.  This information is not intended to replace advice given to you by your health care provider. Make sure you discuss any questions you have with your health care provider.   Document Released: 09/16/2005 Document Revised: 08/15/2014 Document Reviewed: 03/27/2014 Elsevier Interactive Patient Education [employer].

## 2015-11-15 DIAGNOSIS — H6120 Impacted cerumen, unspecified ear: Secondary | ICD-10-CM | POA: Insufficient documentation

## 2015-11-15 NOTE — Assessment & Plan Note (Signed)
Use of Debrox advised

## 2015-11-15 NOTE — Assessment & Plan Note (Signed)

## 2016-05-12 ENCOUNTER — Ambulatory Visit (INDEPENDENT_AMBULATORY_CARE_PROVIDER_SITE_OTHER): Payer: Medicare Other

## 2016-05-12 DIAGNOSIS — Z23 Encounter for immunization: Secondary | ICD-10-CM | POA: Diagnosis not present

## 2016-11-10 ENCOUNTER — Telehealth: Payer: Self-pay | Admitting: Radiology

## 2016-11-10 DIAGNOSIS — R5383 Other fatigue: Secondary | ICD-10-CM

## 2016-11-10 DIAGNOSIS — E785 Hyperlipidemia, unspecified: Secondary | ICD-10-CM

## 2016-11-10 DIAGNOSIS — E559 Vitamin D deficiency, unspecified: Secondary | ICD-10-CM

## 2016-11-10 NOTE — Telephone Encounter (Signed)
Ordered, Thank you.

## 2016-11-10 NOTE — Addendum Note (Signed)
Addended by: Crecencio Mc on: 11/10/2016 01:08 PM   Modules accepted: Orders

## 2016-11-10 NOTE — Telephone Encounter (Signed)
Pt coming for labs tomorrow, please place future orders. Thank you 

## 2016-11-11 ENCOUNTER — Other Ambulatory Visit (INDEPENDENT_AMBULATORY_CARE_PROVIDER_SITE_OTHER): Payer: Medicare Other

## 2016-11-11 DIAGNOSIS — E785 Hyperlipidemia, unspecified: Secondary | ICD-10-CM

## 2016-11-11 DIAGNOSIS — E559 Vitamin D deficiency, unspecified: Secondary | ICD-10-CM

## 2016-11-11 DIAGNOSIS — R5383 Other fatigue: Secondary | ICD-10-CM | POA: Diagnosis not present

## 2016-11-11 LAB — CBC WITH DIFFERENTIAL/PLATELET
Basophils Absolute: 0 10*3/uL (ref 0.0–0.1)
Basophils Relative: 0.8 % (ref 0.0–3.0)
EOS PCT: 2.9 % (ref 0.0–5.0)
Eosinophils Absolute: 0.2 10*3/uL (ref 0.0–0.7)
HEMATOCRIT: 43.3 % (ref 36.0–46.0)
Hemoglobin: 14.4 g/dL (ref 12.0–15.0)
LYMPHS ABS: 1.4 10*3/uL (ref 0.7–4.0)
Lymphocytes Relative: 23.9 % (ref 12.0–46.0)
MCHC: 33.2 g/dL (ref 30.0–36.0)
MCV: 91.4 fl (ref 78.0–100.0)
MONOS PCT: 9.5 % (ref 3.0–12.0)
Monocytes Absolute: 0.5 10*3/uL (ref 0.1–1.0)
NEUTROS PCT: 62.9 % (ref 43.0–77.0)
Neutro Abs: 3.6 10*3/uL (ref 1.4–7.7)
Platelets: 181 10*3/uL (ref 150.0–400.0)
RBC: 4.74 Mil/uL (ref 3.87–5.11)
RDW: 13.4 % (ref 11.5–15.5)
WBC: 5.7 10*3/uL (ref 4.0–10.5)

## 2016-11-11 LAB — COMPREHENSIVE METABOLIC PANEL
ALT: 12 U/L (ref 0–35)
AST: 14 U/L (ref 0–37)
Albumin: 4.2 g/dL (ref 3.5–5.2)
Alkaline Phosphatase: 66 U/L (ref 39–117)
BUN: 14 mg/dL (ref 6–23)
CHLORIDE: 110 meq/L (ref 96–112)
CO2: 27 meq/L (ref 19–32)
Calcium: 9.4 mg/dL (ref 8.4–10.5)
Creatinine, Ser: 0.95 mg/dL (ref 0.40–1.20)
GFR: 60.67 mL/min (ref >60.00)
GLUCOSE: 99 mg/dL (ref 70–99)
POTASSIUM: 5.2 meq/L — AB (ref 3.5–5.1)
Sodium: 142 mEq/L (ref 135–145)
Total Bilirubin: 1.5 mg/dL — ABNORMAL HIGH (ref 0.2–1.2)
Total Protein: 6 g/dL (ref 6.0–8.3)

## 2016-11-11 LAB — LIPID PANEL
CHOL/HDL RATIO: 4
Cholesterol: 208 mg/dL — ABNORMAL HIGH (ref 0–200)
HDL: 50 mg/dL (ref >39.00)
LDL CALC: 133 mg/dL — AB (ref 0–99)
NONHDL: 158.21
Triglycerides: 126 mg/dL (ref 0.0–149.0)
VLDL: 25.2 mg/dL (ref 0.0–40.0)

## 2016-11-11 LAB — TSH: TSH: 1.33 u[IU]/mL (ref 0.35–4.50)

## 2016-11-11 LAB — VITAMIN D 25 HYDROXY (VIT D DEFICIENCY, FRACTURES): VITD: 34.94 ng/mL (ref 30.00–100.00)

## 2016-11-13 ENCOUNTER — Encounter: Payer: Self-pay | Admitting: Internal Medicine

## 2016-11-16 ENCOUNTER — Ambulatory Visit (INDEPENDENT_AMBULATORY_CARE_PROVIDER_SITE_OTHER): Payer: Medicare Other | Admitting: Internal Medicine

## 2016-11-16 ENCOUNTER — Encounter: Payer: Self-pay | Admitting: Internal Medicine

## 2016-11-16 VITALS — BP 116/72 | HR 66 | Temp 97.6°F | Resp 16 | Ht 64.5 in | Wt 141.0 lb

## 2016-11-16 DIAGNOSIS — E785 Hyperlipidemia, unspecified: Secondary | ICD-10-CM | POA: Diagnosis not present

## 2016-11-16 DIAGNOSIS — Z0000 Encounter for general adult medical examination without abnormal findings: Secondary | ICD-10-CM

## 2016-11-16 DIAGNOSIS — E875 Hyperkalemia: Secondary | ICD-10-CM

## 2016-11-16 LAB — BASIC METABOLIC PANEL WITH GFR
BUN: 16 mg/dL (ref 6–23)
CO2: 23 meq/L (ref 19–32)
Calcium: 9.8 mg/dL (ref 8.4–10.5)
Chloride: 110 meq/L (ref 96–112)
Creatinine, Ser: 0.95 mg/dL (ref 0.40–1.20)
GFR: 60.67 mL/min
Glucose, Bld: 97 mg/dL (ref 70–99)
Potassium: 5.4 meq/L — ABNORMAL HIGH (ref 3.5–5.1)
Sodium: 141 meq/L (ref 135–145)

## 2016-11-16 NOTE — Progress Notes (Signed)
Pre visit review using our clinic review tool, if applicable. No additional management support is needed unless otherwise documented below in the visit note. 

## 2016-11-16 NOTE — Patient Instructions (Addendum)
your cholesterol has risen slightly.  Your ten year risk is slightly elevated at 9%   If you  willing to try Red Yeast Rice capsules, this ancient [place] pharmaceutical may help.  The dose is 600 mg twice daily and they can be obtained OTC.    We  Are repeating your potassium level.    Your weight is excellent   The ShingRx vaccine will be available in about 6 months and IS ADVISED for all interested adults over 50 to prevent shingles   DEXA scan to be ordered at [facility]    I recommend getting the majority of your calcium and Vitamin D  through diet rather than supplements given the recent association of calcium supplements with increased coronary artery calcium scores  Try the almond and cashew milks that most grocery stores  now carry  in the dairy  Section>   They are lactose free:  [employer] brand Almond Light,  Original formula.  Delicious,  Low carb,  Low cal,  Cholesterol free    Health Maintenance for Postmenopausal Women Menopause is a normal process in which your reproductive ability comes to an end. This process happens gradually over a span of months to years, usually between the ages of 79 and 51. Menopause is complete when you have missed 12 consecutive menstrual periods. It is important to talk with your health care provider about some of the most common conditions that affect postmenopausal women, such as heart disease, cancer, and bone loss (osteoporosis). Adopting a healthy lifestyle and getting preventive care can help to promote your health and wellness. Those actions can also lower your chances of developing some of these common conditions. What should I know about menopause? During menopause, you may experience a number of symptoms, such as:  Moderate-to-severe hot flashes.  Night sweats.  Decrease in sex drive.  Mood swings.  Headaches.  Tiredness.  Irritability.  Memory problems.  Insomnia. Choosing to treat or not to treat menopausal changes is an  individual decision that you make with your health care provider. What should I know about hormone replacement therapy and supplements? Hormone therapy products are effective for treating symptoms that are associated with menopause, such as hot flashes and night sweats. Hormone replacement carries certain risks, especially as you become older. If you are thinking about using estrogen or estrogen with progestin treatments, discuss the benefits and risks with your health care provider. What should I know about heart disease and stroke? Heart disease, heart attack, and stroke become more likely as you age. This may be due, in part, to the hormonal changes that your body experiences during menopause. These can affect how your body processes dietary fats, triglycerides, and cholesterol. Heart attack and stroke are both medical emergencies. There are many things that you can do to help prevent heart disease and stroke:  Have your blood pressure checked at least every 1-2 years. High blood pressure causes heart disease and increases the risk of stroke.  If you are 34-46 years old, ask your health care provider if you should take aspirin to prevent a heart attack or a stroke.  Do not use any tobacco products, including cigarettes, chewing tobacco, or electronic cigarettes. If you need help quitting, ask your health care provider.  It is important to eat a healthy diet and maintain a healthy weight.  Be sure to include plenty of vegetables, fruits, low-fat dairy products, and lean protein.  Avoid eating foods that are high in solid fats, added sugars,  or salt (sodium).  Get regular exercise. This is one of the most important things that you can do for your health.  Try to exercise for at least 150 minutes each week. The type of exercise that you do should increase your heart rate and make you sweat. This is known as moderate-intensity exercise.  Try to do strengthening exercises at least twice each  week. Do these in addition to the moderate-intensity exercise.  Know your numbers.Ask your health care provider to check your cholesterol and your blood glucose. Continue to have your blood tested as directed by your health care provider. What should I know about cancer screening? There are several types of cancer. Take the following steps to reduce your risk and to catch any cancer development as early as possible. Breast Cancer  Practice breast self-awareness.  This means understanding how your breasts normally appear and feel.  It also means doing regular breast self-exams. Let your health care provider know about any changes, no matter how small.  If you are 78 or older, have a clinician do a breast exam (clinical breast exam or CBE) every year. Depending on your age, family history, and medical history, it may be recommended that you also have a yearly breast X-ray (mammogram).  If you have a family history of breast cancer, talk with your health care provider about genetic screening.  If you are at high risk for breast cancer, talk with your health care provider about having an MRI and a mammogram every year.  Breast cancer (BRCA) gene test is recommended for women who have family members with BRCA-related cancers. Results of the assessment will determine the need for genetic counseling and BRCA1 and for BRCA2 testing. BRCA-related cancers include these types:  Breast. This occurs in males or females.  Ovarian.  Tubal. This may also be called fallopian tube cancer.  Cancer of the abdominal or pelvic lining (peritoneal cancer).  Prostate.  Pancreatic. Cervical, Uterine, and Ovarian Cancer  Your health care provider may recommend that you be screened regularly for cancer of the pelvic organs. These include your ovaries, uterus, and vagina. This screening involves a pelvic exam, which includes checking for microscopic changes to the surface of your cervix (Pap test).  For women  ages 21-65, health care providers may recommend a pelvic exam and a Pap test every three years. For women ages 42-65, they may recommend the Pap test and pelvic exam, combined with testing for human papilloma virus (HPV), every five years. Some types of HPV increase your risk of cervical cancer. Testing for HPV may also be done on women of any age who have unclear Pap test results.  Other health care providers may not recommend any screening for nonpregnant women who are considered low risk for pelvic cancer and have no symptoms. Ask your health care provider if a screening pelvic exam is right for you.  If you have had past treatment for cervical cancer or a condition that could lead to cancer, you need Pap tests and screening for cancer for at least 20 years after your treatment. If Pap tests have been discontinued for you, your risk factors (such as having a new sexual partner) need to be reassessed to determine if you should start having screenings again. Some women have medical problems that increase the chance of getting cervical cancer. In these cases, your health care provider may recommend that you have screening and Pap tests more often.  If you have a family history of uterine  cancer or ovarian cancer, talk with your health care provider about genetic screening.  If you have vaginal bleeding after reaching menopause, tell your health care provider.  There are currently no reliable tests available to screen for ovarian cancer. Lung Cancer  Lung cancer screening is recommended for adults 18-33 years old who are at high risk for lung cancer because of a history of smoking. A yearly low-dose CT scan of the lungs is recommended if you:  Currently smoke.  Have a history of at least 30 pack-years of smoking and you currently smoke or have quit within the past 15 years. A pack-year is smoking an average of one pack of cigarettes per day for one year. Yearly screening should:  Continue until it  has been 15 years since you quit.  Stop if you develop a health problem that would prevent you from having lung cancer treatment. Colorectal Cancer  This type of cancer can be detected and can often be prevented.  Routine colorectal cancer screening usually begins at age 63 and continues through age 23.  If you have risk factors for colon cancer, your health care provider may recommend that you be screened at an earlier age.  If you have a family history of colorectal cancer, talk with your health care provider about genetic screening.  Your health care provider may also recommend using home test kits to check for hidden blood in your stool.  A small camera at the end of a tube can be used to examine your colon directly (sigmoidoscopy or colonoscopy). This is done to check for the earliest forms of colorectal cancer.  Direct examination of the colon should be repeated every 5-10 years until age 52. However, if early forms of precancerous polyps or small growths are found or if you have a family history or genetic risk for colorectal cancer, you may need to be screened more often. Skin Cancer  Check your skin from head to toe regularly.  Monitor any moles. Be sure to tell your health care provider:  About any new moles or changes in moles, especially if there is a change in a mole's shape or color.  If you have a mole that is larger than the size of a pencil eraser.  If any of your family members has a history of skin cancer, especially at a young age, talk with your health care provider about genetic screening.  Always use sunscreen. Apply sunscreen liberally and repeatedly throughout the day.  Whenever you are outside, protect yourself by wearing long sleeves, pants, a wide-brimmed hat, and sunglasses. What should I know about osteoporosis? Osteoporosis is a condition in which bone destruction happens more quickly than new bone creation. After menopause, you may be at an increased  risk for osteoporosis. To help prevent osteoporosis or the bone fractures that can happen because of osteoporosis, the following is recommended:  If you are 11-13 years old, get at least 1,000 mg of calcium and at least 600 mg of vitamin D per day.  If you are older than age 23 but younger than age 22, get at least 1,200 mg of calcium and at least 600 mg of vitamin D per day.  If you are older than age 40, get at least 1,200 mg of calcium and at least 800 mg of vitamin D per day. Smoking and excessive alcohol intake increase the risk of osteoporosis. Eat foods that are rich in calcium and vitamin D, and do weight-bearing exercises several times each week  as directed by your health care provider. What should I know about how menopause affects my mental health? Depression may occur at any age, but it is more common as you become older. Common symptoms of depression include:  Low or sad mood.  Changes in sleep patterns.  Changes in appetite or eating patterns.  Feeling an overall lack of motivation or enjoyment of activities that you previously enjoyed.  Frequent crying spells. Talk with your health care provider if you think that you are experiencing depression. What should I know about immunizations? It is important that you get and maintain your immunizations. These include:  Tetanus, diphtheria, and pertussis (Tdap) booster vaccine.  Influenza every year before the flu season begins.  Pneumonia vaccine.  Shingles vaccine. Your health care provider may also recommend other immunizations. This information is not intended to replace advice given to you by your health care provider. Make sure you discuss any questions you have with your health care provider. Document Released: 09/16/2005 Document Revised: 02/12/2016 Document Reviewed: 04/28/2015 Elsevier Interactive Patient Education  2017 [employer].

## 2016-11-18 ENCOUNTER — Telehealth: Payer: Self-pay | Admitting: Internal Medicine

## 2016-11-18 ENCOUNTER — Encounter: Payer: Self-pay | Admitting: Internal Medicine

## 2016-11-18 DIAGNOSIS — E875 Hyperkalemia: Secondary | ICD-10-CM | POA: Insufficient documentation

## 2016-11-18 DIAGNOSIS — E2839 Other primary ovarian failure: Secondary | ICD-10-CM

## 2016-11-18 DIAGNOSIS — T50905A Adverse effect of unspecified drugs, medicaments and biological substances, initial encounter: Secondary | ICD-10-CM | POA: Insufficient documentation

## 2016-11-18 MED ORDER — SODIUM POLYSTYRENE SULFONATE 15 GM/60ML PO SUSP: 15.0000 g | Freq: Every day | ORAL | 0 refills | Status: DC

## 2016-11-18 NOTE — Telephone Encounter (Signed)
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 Spoke with Dr. Derrel Nip verbally and she stated that was correct.

## 2016-11-18 NOTE — Assessment & Plan Note (Signed)
Cause unclear but persistent, on repeat bmet.  kayexelate ordered,  Daily x 4,  rtc one week for bmet

## 2016-11-18 NOTE — Telephone Encounter (Signed)
Pt called about needing a order to get a Bone density at [facility]. Thank you!  Call pt @ [phone].

## 2016-11-18 NOTE — Telephone Encounter (Signed)
Done.  Now that you are ordering DEXAs  ,  Do you know what code to use so it gets paid for by insurance?

## 2016-11-18 NOTE — Telephone Encounter (Signed)
Dexa has been ordered. Can you sign off on the order as well?

## 2016-11-19 NOTE — Assessment & Plan Note (Signed)
She has no interest in starting medications,      Lab Results  Component Value Date   CHOL 208 (H) 11/11/2016   HDL 50.00 11/11/2016   LDLCALC 133 (H) 11/11/2016   LDLDIRECT 160.4 05/28/2013   TRIG 126.0 11/11/2016   CHOLHDL 4 11/11/2016

## 2016-11-19 NOTE — Assessment & Plan Note (Addendum)
Annual comprehensive preventive exam was done as well as an evaluation and management of chronic conditions .  During the course of the visit the patient was educated and counseled about appropriate screening and preventive services including :  diabetes screening, lipid analysis with projected  10 year  risk for CAD , nutrition counseling, breast, cervical and colorectal cancer screening, and recommended immunizations.  Printed recommendations for health maintenance screenings was given.  She has no interest in continuing colon or breast cancer screening

## 2016-11-19 NOTE — Progress Notes (Signed)
Patient ID: Maria Christensen, female    DOB: 1940/08/20  Age: 76 y.o. MRN: [id number]  The patient is here for her  annual  examination and management of other chronic and acute problems.   The risk factors are reflected in the social history.  The roster of all physicians providing medical care to patient - is listed in the Snapshot section of the chart.  Activities of daily living:  The patient is 100% independent in all ADLs: dressing, toileting, feeding as well as independent mobility  Home safety : The patient has smoke detectors in the home. They wear seatbelts.  There are no firearms at home. There is no violence in the home.   There is no risks for hepatitis, STDs or HIV. There is no   history of blood transfusion. They have no travel history to infectious disease endemic areas of the world.  The patient has seen their dentist in the last six month. They have seen their eye doctor in the last year. They admit to slight hearing difficulty with regard to whispered voices and some television programs.  They have deferred audiologic testing in the last year.  They do not  have excessive sun exposure. Discussed the need for sun protection: hats, long sleeves and use of sunscreen if there is significant sun exposure.   Diet: the importance of a healthy diet is discussed. They do have a healthy diet.  The benefits of regular aerobic exercise were discussed. She walks 4 times per week ,  20 minutes.   Depression screen: there are no signs or vegative symptoms of depression- irritability, change in appetite, anhedonia, sadness/tearfullness.  Cognitive assessment: the patient manages all their financial and personal affairs and is actively engaged. They could relate day,date,year and events; recalled 2/3 objects at 3 minutes; performed clock-face test normally.  The following portions of the patient's history were reviewed and updated as appropriate: allergies, current medications, past family  history, past medical history,  past surgical history, past social history  and problem list.  Visual acuity was not assessed per patient preference since she has regular follow up with her ophthalmologist. Hearing and body mass index were assessed and reviewed.   During the course of the visit the patient was educated and counseled about appropriate screening and preventive services including : fall prevention , diabetes screening, nutrition counseling, colorectal cancer screening, and recommended immunizations.    CC: The primary encounter diagnosis was Hyperkalemia. Diagnoses of Hyperlipidemia with target LDL less than 160 and Encounter for preventive health examination were also pertinent to this visit.  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   She has a past surgical history that includes Abdominal hysterectomy; Bunionectomy (2002); Cleft palate repair; and Cataract extraction w/PHACO (Left, 04/01/2015).   Her family history includes Cancer in her father; Mental illness in her mother; Osteoporosis in her mother.She reports that she quit smoking about 55 years ago. She has never used smokeless tobacco. She reports that she does not drink alcohol or use drugs.  Outpatient Medications Prior to Visit  Medication Sig Dispense Refill  . ALPHAGAN P 0.1 % SOLN Place 1 drop into both eyes 2 (two) times daily.     . Calcium-Magnesium-Vitamin D (CITRACAL CALCIUM+D) 256-38-937 MG-MG-UNIT TB24 Take by mouth.    . dorzolamide-timolol (COSOPT) 22.3-6.8 MG/ML ophthalmic solution Place 1 drop into both eyes 2 (two) times daily.     Marland Kitchen  LUMIGAN 0.01 % SOLN Place 1 drop into both eyes at bedtime.     . multivitamin-lutein (OCUVITE-LUTEIN) CAPS Take 1 capsule by mouth daily.  0   No facility-administered medications prior to visit.     Review of Systems   Patient  denies headache, fevers, malaise, unintentional weight loss, skin rash, eye pain, sinus congestion and sinus pain, sore throat, dysphagia,  hemoptysis , cough, dyspnea, wheezing, chest pain, palpitations, orthopnea, edema, abdominal pain, nausea, melena, diarrhea, constipation, flank pain, dysuria, hematuria, urinary  Frequency, nocturia, numbness, tingling, seizures,  Focal weakness, Loss of consciousness,  Tremor, insomnia, depression, anxiety, and suicidal ideation.     Objective:  BP 116/72   Pulse 66   Temp 97.6 F (36.4 C) (Oral)   Resp 16   Ht 5' 4.5" (1.638 m)   Wt 141 lb (64 kg)   SpO2 98%   BMI 23.83 kg/m   Physical Exam   General appearance: alert, cooperative and appears stated age Head: Normocephalic, without obvious abnormality, atraumatic Eyes: conjunctivae/corneas clear. PERRL, EOM's intact. Fundi benign. Ears: normal TM's and external ear canals both ears Nose: Nares normal. Septum midline. Mucosa normal. No drainage or sinus tenderness. Throat: lips, mucosa, and tongue normal; teeth and gums normal Neck: no adenopathy, no carotid bruit, no JVD, supple, symmetrical, trachea midline and thyroid not enlarged, symmetric, no tenderness/mass/nodules Lungs: clear to auscultation bilaterally Breasts: normal appearance, no masses or tenderness Heart: regular rate and rhythm, S1, S2 normal, no murmur, click, rub or gallop Abdomen: soft, non-tender; bowel sounds normal; no masses,  no organomegaly Extremities: extremities normal, atraumatic, no cyanosis or edema Pulses: 2+ and symmetric Skin: Skin color, texture, turgor normal. No rashes or lesions Neurologic: Alert and oriented X 3, normal strength and tone. Normal symmetric reflexes. Normal coordination and gait.      Assessment & Plan:   Problem List Items Addressed This Visit    Encounter for preventive health examination    Annual comprehensive preventive exam was done as well as an evaluation and management of  chronic conditions .  During the course of the visit the patient was educated and counseled about appropriate screening and preventive services including :  diabetes screening, lipid analysis with projected  10 year  risk for CAD , nutrition counseling, breast, cervical and colorectal cancer screening, and recommended immunizations.  Printed recommendations for health maintenance screenings was given.  She has no interest in continuing colon or breast cancer screening       Hyperkalemia - Primary    Cause unclear but persistent, on repeat bmet.  kayexelate ordered,  Daily x 4,  rtc one week for bmet       Relevant Orders   Basic metabolic panel (Completed)   Basic metabolic panel   Hyperlipidemia with target LDL less than 160    She has no interest in starting medications,      Lab Results  Component Value Date   CHOL 208 (H) 11/11/2016   HDL 50.00 11/11/2016   LDLCALC 133 (H) 11/11/2016   LDLDIRECT 160.4 05/28/2013   TRIG 126.0 11/11/2016   CHOLHDL 4 11/11/2016            I am having Ms. Julius start on sodium polystyrene. I am also having her maintain her dorzolamide-timolol, Calcium-Magnesium-Vitamin D, multivitamin-lutein, LUMIGAN, and ALPHAGAN P.  Meds ordered this encounter  Medications  . sodium polystyrene (KAYEXALATE) 15 GM/60ML suspension    Sig: Take 60 mLs (15 g total) by mouth daily.  Dispense:  240 mL    Refill:  0    There are no discontinued medications.  Follow-up: No Follow-up on file.   Crecencio Mc, MD

## 2016-12-12 ENCOUNTER — Encounter: Payer: Self-pay | Admitting: Internal Medicine

## 2016-12-12 ENCOUNTER — Other Ambulatory Visit (INDEPENDENT_AMBULATORY_CARE_PROVIDER_SITE_OTHER): Payer: Medicare Other

## 2016-12-12 DIAGNOSIS — E875 Hyperkalemia: Secondary | ICD-10-CM | POA: Diagnosis not present

## 2016-12-12 LAB — BASIC METABOLIC PANEL
BUN: 9 mg/dL (ref 6–23)
CALCIUM: 9.2 mg/dL (ref 8.4–10.5)
CO2: 26 meq/L (ref 19–32)
CREATININE: 0.93 mg/dL (ref 0.40–1.20)
Chloride: 110 mEq/L (ref 96–112)
GFR: 62.17 mL/min (ref >60.00)
Glucose, Bld: 95 mg/dL (ref 70–99)
Potassium: 3.7 mEq/L (ref 3.5–5.1)
Sodium: 144 mEq/L (ref 135–145)

## 2016-12-14 LAB — HM DEXA SCAN: HM Dexa Scan: NORMAL

## 2016-12-26 ENCOUNTER — Encounter: Payer: Self-pay | Admitting: Internal Medicine

## 2017-01-03 ENCOUNTER — Encounter: Payer: Self-pay | Admitting: Internal Medicine

## 2017-05-03 ENCOUNTER — Ambulatory Visit (INDEPENDENT_AMBULATORY_CARE_PROVIDER_SITE_OTHER): Payer: Medicare Other

## 2017-05-03 DIAGNOSIS — Z23 Encounter for immunization: Secondary | ICD-10-CM

## 2017-05-12 ENCOUNTER — Ambulatory Visit (INDEPENDENT_AMBULATORY_CARE_PROVIDER_SITE_OTHER): Payer: Medicare Other

## 2017-05-12 VITALS — BP 118/70 | HR 64 | Temp 98.3°F | Resp 14 | Ht 64.5 in | Wt 141.8 lb

## 2017-05-12 DIAGNOSIS — Z0000 Encounter for general adult medical examination without abnormal findings: Secondary | ICD-10-CM | POA: Diagnosis not present

## 2017-05-12 DIAGNOSIS — Z1331 Encounter for screening for depression: Secondary | ICD-10-CM | POA: Diagnosis not present

## 2017-05-12 NOTE — Progress Notes (Signed)
Subjective:   Maria Christensen is a 76 y.o. female who presents for Medicare Annual (Subsequent) preventive examination.  Review of Systems:  No ROS.  Medicare Wellness Visit. Additional risk factors are reflected in the social history.  Cardiac Risk Factors include: advanced age (>34men, >21 women)     Objective:     Vitals: BP 118/70 (BP Location: Left Arm, Patient Position: Sitting, Cuff Size: Normal)   Pulse 64   Temp 98.3 F (36.8 C) (Oral)   Resp 14   Ht 5' 4.5" (1.638 m)   Wt 141 lb 12.8 oz (64.3 kg)   SpO2 99%   BMI 23.96 kg/m   Body mass index is 23.96 kg/m.   Tobacco History  Smoking Status  . Former Smoker  . Quit date: 11/15/1961  Smokeless Tobacco  . Never Used     Counseling given: Not Answered   Past Medical History:  Diagnosis Date  . Cerebral hemorrhage ([place]) 2005  . Cleft palate   . Glaucoma    managed b [place],  bilateral  . Herpes zoster   . Mitral valve prolapse    2005 no deficits  . Open-angle glaucoma   . Osteopenia    Past Surgical History:  Procedure Laterality Date  . ABDOMINAL HYSTERECTOMY    . BUNIONECTOMY  2002  . CATARACT EXTRACTION W/PHACO Left 04/01/2015   Procedure: CATARACT EXTRACTION PHACO AND INTRAOCULAR LENS PLACEMENT (IOC);  Surgeon: Leandrew Koyanagi, MD;  Location: [facility];  Service: Ophthalmology;  Laterality: Left;  . CLEFT PALATE REPAIR     Family History  Problem Relation Age of Onset  . Mental illness Mother        dementia  . Osteoporosis Mother   . Cancer Father        colon  . Heart attack Brother   . Diabetes Brother   . Glaucoma Brother   . Hypertension Brother    History  Sexual Activity  . Sexual activity: Not on file    Outpatient Encounter Prescriptions as of 05/12/2017  Medication Sig  . ALPHAGAN P 0.1 % SOLN Place 1 drop into both eyes 2 (two) times daily.   . Calcium-Magnesium-Vitamin D (CITRACAL CALCIUM+D) 767-34-193 MG-MG-UNIT TB24 Take by mouth.  .  dorzolamide-timolol (COSOPT) 22.3-6.8 MG/ML ophthalmic solution Place 1 drop into both eyes 2 (two) times daily.   Marland Kitchen LUMIGAN 0.01 % SOLN Place 1 drop into both eyes at bedtime.   . multivitamin-lutein (OCUVITE-LUTEIN) CAPS Take 1 capsule by mouth daily.  . [DISCONTINUED] sodium polystyrene (KAYEXALATE) 15 GM/60ML suspension Take 60 mLs (15 g total) by mouth daily.   No facility-administered encounter medications on file as of 05/12/2017.     Activities of Daily Living In your present state of health, do you have any difficulty performing the following activities: 05/12/2017 11/16/2016  Hearing? Tempie Donning  Vision? N N  Difficulty concentrating or making decisions? Tempie Donning  Comment Age appropriate memory delay  -  Walking or climbing stairs? N N  Dressing or bathing? N N  Doing errands, shopping? N N  Preparing Food and eating ? N -  Using the Toilet? N -  In the past six months, have you accidently leaked urine? N -  Do you have problems with loss of bowel control? N -  Managing your Medications? N -  Managing your Finances? N -  Housekeeping or managing your Housekeeping? N -  Some recent data might be hidden    Patient Care Team: Derrel Nip,  Aris Everts, MD as PCP - General (Internal Medicine)    Assessment:    This is a routine wellness examination for [facility]. The goal of the wellness visit is to assist the patient how to close the gaps in care and create a preventative care plan for the patient.   The roster of all physicians providing medical care to patient is listed in the Snapshot section of the chart.  Taking calcium VIT D as appropriate/Osteoporosis risk reviewed.    Safety issues reviewed; Lives alone. Smoke and carbon monoxide detectors in the home. No firearms in the home.  Wears seatbelts when driving or riding with others. Patient does wear sunscreen or protective clothing when in direct sunlight. No violence in the home.  Depression- PHQ 2 &9 complete.  No signs/symptoms or  verbal communication regarding little pleasure in doing things, feeling down, depressed or hopeless. No changes in sleeping, energy, eating, concentrating.  No thoughts of self harm or harm towards others.  Time spent on this topic is 12 minutes.   Patient is alert, normal appearance, oriented to person/place/and time.  Correctly identified the president of the [place], recall of 3/3 words, and performing simple calculations. Displays appropriate judgement and can read correct time from watch face.   No new identified risk were noted.  No failures at ADL's or IADL's.    BMI- discussed the importance of a healthy diet, water intake and the benefits of aerobic exercise. Educational material provided.   24 hour diet recall: Breakfast: none Lunch: fish sandwich  Dinner: peanut butter/jelly crackers  Daily fluid intake: 1 cups of caffeine, half cup of water Encouraged to increase water intake.    Dental- every 6 months.  Eye- Visual acuity not assessed per patient preference since they have regular follow up with the ophthalmologist.  Wears corrective lenses.  Sleep patterns- Sleeps 6-7 hours at night.  Wakes feeling rested.  Health maintenance gaps- closed.  Patient Concerns: None at this time. Follow up with PCP as needed.  Exercise Activities and Dietary recommendations Current Exercise Habits: Home exercise routine, Type of exercise: calisthenics (Yard work, Brewing technologist), Time (Minutes): 15, Frequency (Times/Week): 3, Weekly Exercise (Minutes/Week): 45, Intensity: Mild  Goals    . Increase physical activity          Walk for exercise at the mall 3 days, 30 minutes     . Increase water intake          Stay hydrated      Fall Risk Fall Risk  05/12/2017 11/16/2016 11/12/2015 11/14/2014 05/28/2013  Falls in the past year? No No Yes No No  Number falls in past yr: - - 1 - -  Injury with Fall? - - No - -  Risk for fall due to : - - Other (Comment) - -  Follow up - - Education  provided - -   Depression Screen PHQ 2/9 Scores 05/12/2017 11/16/2016 11/12/2015 11/14/2014  PHQ - 2 Score 0 0 0 0  PHQ- 9 Score 0 - - -     Cognitive Function MMSE - Mini Mental State Exam 05/12/2017  Orientation to time 5  Orientation to Place 5  Registration 3  Attention/ Calculation 5  Recall 3  Language- name 2 objects 2  Language- repeat 1  Language- follow 3 step command 3  Language- read & follow direction 1  Write a sentence 1  Copy design 1  Total score 30        Immunization History  Administered Date(s) Administered  . Influenza Split 05/17/2012  . Influenza Whole 05/17/2012  . Influenza, High Dose Seasonal PF 05/26/2015, 05/12/2016, 05/03/2017  . Influenza,inj,Quad PF,6+ Mos 05/28/2013, 05/03/2014  . Pneumococcal Conjugate-13 10/29/2014  . Pneumococcal Polysaccharide-23 04/28/2010  . Td 02/14/2009  . Tdap 03/04/2009  . Zoster 06/12/2006  . Zoster Recombinat (Shingrix) 12/05/2016, 03/15/2017   Screening Tests Health Maintenance  Topic Date Due  . TETANUS/TDAP  05/17/2022  . INFLUENZA VACCINE  Completed  . DEXA SCAN  Completed  . PNA vac Low Risk Adult  Completed      Plan:    End of life planning; Advance aging; Advanced directives discussed. Copy of current HCPOA/Living Will on file.    I have personally reviewed and noted the following in the patient's chart:   . Medical and social history . Use of alcohol, tobacco or illicit drugs  . Current medications and supplements . Functional ability and status . Nutritional status . Physical activity . Advanced directives . List of other physicians . Hospitalizations, surgeries, and ER visits in previous 12 months . Vitals . Screenings to include cognitive, depression, and falls . Referrals and appointments  In addition, I have reviewed and discussed with patient certain preventive protocols, quality metrics, and best practice recommendations. A written personalized care plan for preventive services as  well as general preventive health recommendations were provided to patient.     OBrien-Blaney, Jaime Dome L, LPN  [id number]    I have reviewed the above information and agree with above.   Deborra Medina, MD

## 2017-05-12 NOTE — Patient Instructions (Addendum)
Maria Christensen , Thank you for taking time to come for your Medicare Wellness Visit. I appreciate your ongoing commitment to your health goals. Please review the following plan we discussed and let me know if I can assist you in the future.   Follow up with Dr. Derrel Nip as needed.    Have a great day!  These are the goals we discussed: Goals    . Increase physical activity          Walk for exercise at the mall 3 days, 30 minutes     . Increase water intake          Stay hydrated       This is a list of the screening recommended for you and due dates:  Health Maintenance  Topic Date Due  . Tetanus Vaccine  05/17/2022  . Flu Shot  Completed  . DEXA scan (bone density measurement)  Completed  . Pneumonia vaccines  Completed    Hearing Loss Hearing loss is a partial or total loss of the ability to hear. This can be temporary or permanent, and it can happen in one or both ears. Hearing loss may be referred to as deafness. Medical care is necessary to treat hearing loss properly and to prevent the condition from getting worse. Your hearing may partially or completely come back, depending on what caused your hearing loss and how severe it is. In some cases, hearing loss is permanent. What are the causes? Common causes of hearing loss include:  Too much wax in the ear canal.  Infection of the ear canal or middle ear.  Fluid in the middle ear.  Injury to the ear or surrounding area.  An object stuck in the ear.  Prolonged exposure to loud sounds, such as music.  Less common causes of hearing loss include:  Tumors in the ear.  Viral or bacterial infections, such as meningitis.  A hole in the eardrum (perforated eardrum).  Problems with the hearing nerve that sends signals between the brain and the ear.  Certain medicines.  What are the signs or symptoms? Symptoms of this condition may include:  Difficulty telling the difference between sounds.  Difficulty following  a conversation when there is background noise.  Lack of response to sounds in your environment. This may be most noticeable when you do not respond to startling sounds.  Needing to turn up the volume on the television, radio, etc.  Ringing in the ears.  Dizziness.  Pain in the ears.  How is this diagnosed? This condition is diagnosed based on a physical exam and a hearing test (audiometry). The audiometry test will be performed by a hearing specialist (audiologist). You may also be referred to an ear, nose, and throat (ENT) specialist (otolaryngologist). How is this treated? Treatment for recent onset of hearing loss may include:  Ear wax removal.  Being prescribed medicines to prevent infection (antibiotics).  Being prescribed medicines to reduce inflammation (corticosteroids).  Follow these instructions at home:  If you were prescribed an antibiotic medicine, take it as told by your health care provider. Do not stop taking the antibiotic even if you start to feel better.  Take over-the-counter and prescription medicines only as told by your health care provider.  Avoid loud noises.  Return to your normal activities as told by your health care provider. Ask your health care provider what activities are safe for you.  Keep all follow-up visits as told by your health care provider. This is  important. Contact a health care provider if:  You feel dizzy.  You develop new symptoms.  You vomit or feel nauseous.  You have a fever. Get help right away if:  You develop sudden changes in your vision.  You have severe ear pain.  You have new or increased weakness.  You have a severe headache. This information is not intended to replace advice given to you by your health care provider. Make sure you discuss any questions you have with your health care provider. Document Released: 07/25/2005 Document Revised: 12/31/2015 Document Reviewed: 12/10/2014 Elsevier Interactive  Patient Education  2018 [employer].

## 2017-11-17 ENCOUNTER — Encounter: Payer: Medicare Other | Admitting: Internal Medicine

## 2018-05-15 ENCOUNTER — Ambulatory Visit: Payer: Medicare Other

## 2018-06-01 ENCOUNTER — Ambulatory Visit: Payer: Medicare Other

## 2018-06-26 ENCOUNTER — Encounter: Payer: Self-pay | Admitting: Internal Medicine

## 2018-06-26 ENCOUNTER — Ambulatory Visit: Payer: Medicare Other | Admitting: Internal Medicine

## 2018-06-26 VITALS — BP 144/70 | HR 70 | Temp 98.1°F | Resp 15 | Ht 64.5 in | Wt 128.6 lb

## 2018-06-26 DIAGNOSIS — D751 Secondary polycythemia: Secondary | ICD-10-CM

## 2018-06-26 DIAGNOSIS — E875 Hyperkalemia: Secondary | ICD-10-CM | POA: Diagnosis not present

## 2018-06-26 DIAGNOSIS — E785 Hyperlipidemia, unspecified: Secondary | ICD-10-CM | POA: Diagnosis not present

## 2018-06-26 DIAGNOSIS — Z0000 Encounter for general adult medical examination without abnormal findings: Secondary | ICD-10-CM

## 2018-06-26 DIAGNOSIS — Z7189 Other specified counseling: Secondary | ICD-10-CM

## 2018-06-26 NOTE — Progress Notes (Signed)
Patient ID: Maria Christensen, female    DOB: 04/20/1941  Age: 77 y.o. MRN: [id number]  The patient is here for annual preventive  examination and management of other chronic and acute problems.   fobt negative in 2014 Declines mammogram    The risk factors are reflected in the social history.  The roster of all physicians providing medical care to patient - is listed in the Snapshot section of the chart.  Activities of daily living:  The patient is 100% independent in all ADLs: dressing, toileting, feeding as well as independent mobility  Home safety : The patient has smoke detectors in the home. They wear seatbelts.  There are no firearms at home. There is no violence in the home.   There is no risks for hepatitis, STDs or HIV. There is no   history of blood transfusion. They have no travel history to infectious disease endemic areas of the world.  The patient has seen their dentist in the last six month. They have seen their eye doctor in the last year. They admit to slight hearing difficulty with regard to whispered voices and some television programs.  They have deferred audiologic testing in the last year.  They do not  have excessive sun exposure. Discussed the need for sun protection: hats, long sleeves and use of sunscreen if there is significant sun exposure.   Diet: the importance of a healthy diet is discussed. They do have a healthy diet.  The benefits of regular aerobic exercise were discussed. She walks 4 times per week ,  20 minutes.   Depression screen: there are no signs or vegative symptoms of depression- irritability, change in appetite, anhedonia, sadness/tearfullness.  Cognitive assessment: the patient manages all their financial and personal affairs and is actively engaged. They could relate day,date,year and events; recalled 2/3 objects at 3 minutes; performed clock-face test normally.  The following portions of the patient's history were reviewed and updated as  appropriate: allergies, current medications, past family history, past medical history,  past surgical history, past social history  and problem list.  Visual acuity was not assessed per patient preference since she has regular follow up with her ophthalmologist. Hearing and body mass index were assessed and reviewed.   During the course of the visit the patient was educated and counseled about appropriate screening and preventive services including : fall prevention , diabetes screening, nutrition counseling, colorectal cancer screening, and recommended immunizations.    CC: The primary encounter diagnosis was Hyperlipidemia with target LDL less than 160. Diagnoses of Hyperkalemia, Polycythemia, secondary, Encounter for preventive health examination, and Do not resuscitate discussion were also pertinent to this visit.  1) elevated BP:   Grieving the loss of her brother to advanced lung CA last month, died  11 days after diagnosis.  Feels anger at the physicians who gave him a longer prognosis .   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   She has a past surgical history that includes Abdominal hysterectomy; Bunionectomy (2002); Cleft palate repair; and Cataract extraction w/PHACO (Left, 04/01/2015).   Her family history includes Cancer in her father; Diabetes in her brother; Glaucoma (age of onset: 5) in her brother; Heart attack in her brother; Hypertension in her  brother; Mental illness in her mother; Osteoporosis in her mother.She reports that she quit smoking about 56 years ago. She has never used smokeless tobacco. She reports that she does not drink alcohol or use drugs.  Outpatient Medications Prior to Visit  Medication Sig  Dispense Refill  . ALPHAGAN P 0.1 % SOLN Place 1 drop into both eyes 2 (two) times daily.     . dorzolamide-timolol (COSOPT) 22.3-6.8 MG/ML ophthalmic solution Place 1 drop into both eyes 2 (two) times daily.     Marland Kitchen LUMIGAN 0.01 % SOLN Place 1 drop into both eyes at bedtime.     . multivitamin-lutein (OCUVITE-LUTEIN) CAPS Take 1 capsule by mouth daily.  0  . Calcium-Magnesium-Vitamin D (CITRACAL CALCIUM+D) [phone] MG-MG-UNIT TB24 Take by mouth.     No facility-administered medications prior to visit.     Review of Systems   Patient denies headache, fevers, malaise, unintentional weight loss, skin rash, eye pain, sinus congestion and sinus pain, sore throat, dysphagia,  hemoptysis , cough, dyspnea, wheezing, chest pain, palpitations, orthopnea, edema, abdominal pain, nausea, melena, diarrhea, constipation, flank pain, dysuria, hematuria, urinary  Frequency, nocturia, numbness, tingling, seizures,  Focal weakness, Loss of consciousness,  Tremor, insomnia, depression, anxiety, and suicidal ideation.      Objective:  BP (!) 144/70 (BP Location: Left Arm, Patient Position: Sitting, Cuff Size: Normal)   Pulse 70   Temp 98.1 F (36.7 C) (Oral)   Resp 15   Ht 5' 4.5" (1.638 m)   Wt 128 lb 9.6 oz (58.3 kg)   SpO2 97%   BMI 21.73 kg/m   Physical Exam   General appearance: alert, cooperative and appears stated age Head: Normocephalic, without obvious abnormality, atraumatic Eyes: conjunctivae/corneas clear. PERRL, EOM's intact. Fundi benign. Ears: normal TM's and external ear canals both ears Nose: Nares normal. Septum midline. Mucosa normal. No drainage or sinus tenderness. Throat: lips, mucosa, and tongue normal; teeth and gums normal Neck: no adenopathy, no carotid bruit, no JVD, supple, symmetrical, trachea midline and thyroid not enlarged, symmetric, no tenderness/mass/nodules Lungs: clear to auscultation bilaterally Breasts: deferred per patient preference Heart: regular rate and  rhythm, S1, S2 normal, no murmur, click, rub or gallop Abdomen: soft, non-tender; bowel sounds normal; no masses,  no organomegaly Extremities: extremities normal, atraumatic, no cyanosis or edema Pulses: 2+ and symmetric Skin: Skin color, texture, turgor normal. No rashes or lesions Neurologic: Alert and oriented X 3, normal strength and tone. Normal symmetric reflexes. Normal coordination and gait.      Assessment & Plan:   Problem List Items Addressed This Visit    Do not resuscitate discussion    Reviewed patient's  Do Not Resuscitate Orders which were made last year.  Patient continues to desire DNR status and has the order prominently displaced in the home.       Encounter for preventive health examination    age appropriate education and counseling updated, referrals for preventative services and immunizations addressed, dietary and smoking counseling addressed, most recent labs reviewed.  I have personally reviewed and have noted:  1) the patient's medical and social history 2) The pt's use of alcohol, tobacco, and illicit drugs 3) The patient's current medications and supplements 4) Functional ability including ADL's, fall risk, home safety risk, hearing and visual impairment 5) Diet and physical activities 6) Evidence for depression or mood disorder 7) The patient's height, weight, and BMI have been recorded in the chart  I have made referrals, and provided counseling and education based on  review of the above      RESOLVED: Hyperkalemia   Relevant Orders   Comprehensive metabolic panel (Completed)   Hyperlipidemia with target LDL less than 160 - Primary    She has no interest in starting medications,      Lab Results  Component Value Date   CHOL 171 06/28/2018   HDL 56.80 06/28/2018   LDLCALC 100 (H) 06/28/2018   LDLDIRECT 160.4 05/28/2013   TRIG 71.0 06/28/2018   CHOLHDL 3 06/28/2018         Relevant Orders   Lipid panel (Completed)   TSH (Completed)    Polycythemia, secondary   Relevant Orders   CBC with Differential/Platelet (Completed)      I have discontinued Maricela Bo. Braud's Calcium-Magnesium-Vitamin D. I am also having her maintain her dorzolamide-timolol, multivitamin-lutein, LUMIGAN, and ALPHAGAN P.  No orders of the defined types were placed in this encounter.   Medications Discontinued During This Encounter  Medication Reason  . Calcium-Magnesium-Vitamin D (CITRACAL CALCIUM+D) [phone] MG-MG-UNIT TB24 Patient Preference    Follow-up: Return in about 1 year (around 06/27/2019).   Crecencio Mc, MD

## 2018-06-26 NOTE — Patient Instructions (Signed)
I'm so sorry about losing Maria Christensen  Please let me know if you have any trouble coping in the coming months   I will order fasting labs for you to have done here at your leisure   Food choices we discussed (Best> better)  ALMOND BUTTER > CASHEW BUTTER> PEANUT BUTTER  CANNED SALMON> TUNA  BROCCOLI!!! OVER EVERYTHING!  SWEET POTATOES:  PRECOOK,  CHILL ,  THEN HEAT UP AGAIN  GOAT CHEESE > [place] Maintenance for Postmenopausal Women Menopause is a normal process in which your reproductive ability comes to an end. This process happens gradually over a span of months to years, usually between the ages of 68 and 56. Menopause is complete when you have missed 12 consecutive menstrual periods. It is important to talk with your health care provider about some of the most common conditions that affect postmenopausal women, such as heart disease, cancer, and bone loss (osteoporosis). Adopting a healthy lifestyle and getting preventive care can help to promote your health and wellness. Those actions can also lower your chances of developing some of these common conditions. What should I know about menopause? During menopause, you may experience a number of symptoms, such as:  Moderate-to-severe hot flashes.  Night sweats.  Decrease in sex drive.  Mood swings.  Headaches.  Tiredness.  Irritability.  Memory problems.  Insomnia.  Choosing to treat or not to treat menopausal changes is an individual decision that you make with your health care provider. What should I know about hormone replacement therapy and supplements? Hormone therapy products are effective for treating symptoms that are associated with menopause, such as hot flashes and night sweats. Hormone replacement carries certain risks, especially as you become older. If you are thinking about using estrogen or estrogen with progestin treatments, discuss the benefits and risks with your health care provider. What should I  know about heart disease and stroke? Heart disease, heart attack, and stroke become more likely as you age. This may be due, in part, to the hormonal changes that your body experiences during menopause. These can affect how your body processes dietary fats, triglycerides, and cholesterol. Heart attack and stroke are both medical emergencies. There are many things that you can do to help prevent heart disease and stroke:  Have your blood pressure checked at least every 1-2 years. High blood pressure causes heart disease and increases the risk of stroke.  If you are 56-64 years old, ask your health care provider if you should take aspirin to prevent a heart attack or a stroke.  Do not use any tobacco products, including cigarettes, chewing tobacco, or electronic cigarettes. If you need help quitting, ask your health care provider.  It is important to eat a healthy diet and maintain a healthy weight. ? Be sure to include plenty of vegetables, fruits, low-fat dairy products, and lean protein. ? Avoid eating foods that are high in solid fats, added sugars, or salt (sodium).  Get regular exercise. This is one of the most important things that you can do for your health. ? Try to exercise for at least 150 minutes each week. The type of exercise that you do should increase your heart rate and make you sweat. This is known as moderate-intensity exercise. ? Try to do strengthening exercises at least twice each week. Do these in addition to the moderate-intensity exercise.  Know your numbers.Ask your health care provider to check your cholesterol and your blood glucose. Continue to have your blood  tested as directed by your health care provider.  What should I know about cancer screening? There are several types of cancer. Take the following steps to reduce your risk and to catch any cancer development as early as possible. Breast Cancer  Practice breast self-awareness. ? This means understanding how  your breasts normally appear and feel. ? It also means doing regular breast self-exams. Let your health care provider know about any changes, no matter how small.  If you are 37 or older, have a clinician do a breast exam (clinical breast exam or CBE) every year. Depending on your age, family history, and medical history, it may be recommended that you also have a yearly breast X-ray (mammogram).  If you have a family history of breast cancer, talk with your health care provider about genetic screening.  If you are at high risk for breast cancer, talk with your health care provider about having an MRI and a mammogram every year.  Breast cancer (BRCA) gene test is recommended for women who have family members with BRCA-related cancers. Results of the assessment will determine the need for genetic counseling and BRCA1 and for BRCA2 testing. BRCA-related cancers include these types: ? Breast. This occurs in males or females. ? Ovarian. ? Tubal. This may also be called fallopian tube cancer. ? Cancer of the abdominal or pelvic lining (peritoneal cancer). ? Prostate. ? Pancreatic.  Cervical, Uterine, and Ovarian Cancer Your health care provider may recommend that you be screened regularly for cancer of the pelvic organs. These include your ovaries, uterus, and vagina. This screening involves a pelvic exam, which includes checking for microscopic changes to the surface of your cervix (Pap test).  For women ages 21-65, health care providers may recommend a pelvic exam and a Pap test every three years. For women ages 8-65, they may recommend the Pap test and pelvic exam, combined with testing for human papilloma virus (HPV), every five years. Some types of HPV increase your risk of cervical cancer. Testing for HPV may also be done on women of any age who have unclear Pap test results.  Other health care providers may not recommend any screening for nonpregnant women who are considered low risk for  pelvic cancer and have no symptoms. Ask your health care provider if a screening pelvic exam is right for you.  If you have had past treatment for cervical cancer or a condition that could lead to cancer, you need Pap tests and screening for cancer for at least 20 years after your treatment. If Pap tests have been discontinued for you, your risk factors (such as having a new sexual partner) need to be reassessed to determine if you should start having screenings again. Some women have medical problems that increase the chance of getting cervical cancer. In these cases, your health care provider may recommend that you have screening and Pap tests more often.  If you have a family history of uterine cancer or ovarian cancer, talk with your health care provider about genetic screening.  If you have vaginal bleeding after reaching menopause, tell your health care provider.  There are currently no reliable tests available to screen for ovarian cancer.  Lung Cancer Lung cancer screening is recommended for adults 41-31 years old who are at high risk for lung cancer because of a history of smoking. A yearly low-dose CT scan of the lungs is recommended if you:  Currently smoke.  Have a history of at least 30 pack-years of smoking and  you currently smoke or have quit within the past 15 years. A pack-year is smoking an average of one pack of cigarettes per day for one year.  Yearly screening should:  Continue until it has been 15 years since you quit.  Stop if you develop a health problem that would prevent you from having lung cancer treatment.  Colorectal Cancer  This type of cancer can be detected and can often be prevented.  Routine colorectal cancer screening usually begins at age 78 and continues through age 23.  If you have risk factors for colon cancer, your health care provider may recommend that you be screened at an earlier age.  If you have a family history of colorectal cancer, talk  with your health care provider about genetic screening.  Your health care provider may also recommend using home test kits to check for hidden blood in your stool.  A small camera at the end of a tube can be used to examine your colon directly (sigmoidoscopy or colonoscopy). This is done to check for the earliest forms of colorectal cancer.  Direct examination of the colon should be repeated every 5-10 years until age 52. However, if early forms of precancerous polyps or small growths are found or if you have a family history or genetic risk for colorectal cancer, you may need to be screened more often.  Skin Cancer  Check your skin from head to toe regularly.  Monitor any moles. Be sure to tell your health care provider: ? About any new moles or changes in moles, especially if there is a change in a mole's shape or color. ? If you have a mole that is larger than the size of a pencil eraser.  If any of your family members has a history of skin cancer, especially at a young age, talk with your health care provider about genetic screening.  Always use sunscreen. Apply sunscreen liberally and repeatedly throughout the day.  Whenever you are outside, protect yourself by wearing long sleeves, pants, a wide-brimmed hat, and sunglasses.  What should I know about osteoporosis? Osteoporosis is a condition in which bone destruction happens more quickly than new bone creation. After menopause, you may be at an increased risk for osteoporosis. To help prevent osteoporosis or the bone fractures that can happen because of osteoporosis, the following is recommended:  If you are 3-13 years old, get at least 1,000 mg of calcium and at least 600 mg of vitamin D per day.  If you are older than age 22 but younger than age 9, get at least 1,200 mg of calcium and at least 600 mg of vitamin D per day.  If you are older than age 63, get at least 1,200 mg of calcium and at least 800 mg of vitamin D per  day.  Smoking and excessive alcohol intake increase the risk of osteoporosis. Eat foods that are rich in calcium and vitamin D, and do weight-bearing exercises several times each week as directed by your health care provider. What should I know about how menopause affects my mental health? Depression may occur at any age, but it is more common as you become older. Common symptoms of depression include:  Low or sad mood.  Changes in sleep patterns.  Changes in appetite or eating patterns.  Feeling an overall lack of motivation or enjoyment of activities that you previously enjoyed.  Frequent crying spells.  Talk with your health care provider if you think that you are experiencing depression.  What should I know about immunizations? It is important that you get and maintain your immunizations. These include:  Tetanus, diphtheria, and pertussis (Tdap) booster vaccine.  Influenza every year before the flu season begins.  Pneumonia vaccine.  Shingles vaccine.  Your health care provider may also recommend other immunizations. This information is not intended to replace advice given to you by your health care provider. Make sure you discuss any questions you have with your health care provider. Document Released: 09/16/2005 Document Revised: 02/12/2016 Document Reviewed: 04/28/2015 Elsevier Interactive Patient Education  2018 [employer].

## 2018-06-28 ENCOUNTER — Other Ambulatory Visit (INDEPENDENT_AMBULATORY_CARE_PROVIDER_SITE_OTHER): Payer: Medicare Other

## 2018-06-28 DIAGNOSIS — E785 Hyperlipidemia, unspecified: Secondary | ICD-10-CM | POA: Diagnosis not present

## 2018-06-28 DIAGNOSIS — E875 Hyperkalemia: Secondary | ICD-10-CM | POA: Diagnosis not present

## 2018-06-28 DIAGNOSIS — D751 Secondary polycythemia: Secondary | ICD-10-CM

## 2018-06-28 DIAGNOSIS — Z7189 Other specified counseling: Secondary | ICD-10-CM | POA: Insufficient documentation

## 2018-06-28 LAB — LIPID PANEL
CHOL/HDL RATIO: 3
Cholesterol: 171 mg/dL (ref 0–200)
HDL: 56.8 mg/dL (ref >39.00)
LDL CALC: 100 mg/dL — AB (ref 0–99)
NonHDL: 113.98
TRIGLYCERIDES: 71 mg/dL (ref 0.0–149.0)
VLDL: 14.2 mg/dL (ref 0.0–40.0)

## 2018-06-28 LAB — CBC WITH DIFFERENTIAL/PLATELET
Basophils Absolute: 0 10*3/uL (ref 0.0–0.1)
Basophils Relative: 0.5 % (ref 0.0–3.0)
Eosinophils Absolute: 0.1 10*3/uL (ref 0.0–0.7)
Eosinophils Relative: 1.3 % (ref 0.0–5.0)
HCT: 42.7 % (ref 36.0–46.0)
HEMOGLOBIN: 14.4 g/dL (ref 12.0–15.0)
LYMPHS ABS: 1.1 10*3/uL (ref 0.7–4.0)
Lymphocytes Relative: 16.6 % (ref 12.0–46.0)
MCHC: 33.8 g/dL (ref 30.0–36.0)
MCV: 91.2 fl (ref 78.0–100.0)
MONOS PCT: 8.6 % (ref 3.0–12.0)
Monocytes Absolute: 0.6 10*3/uL (ref 0.1–1.0)
NEUTROS PCT: 73 % (ref 43.0–77.0)
Neutro Abs: 5 10*3/uL (ref 1.4–7.7)
Platelets: 191 10*3/uL (ref 150.0–400.0)
RBC: 4.68 Mil/uL (ref 3.87–5.11)
RDW: 13.7 % (ref 11.5–15.5)
WBC: 6.9 10*3/uL (ref 4.0–10.5)

## 2018-06-28 LAB — COMPREHENSIVE METABOLIC PANEL
ALK PHOS: 55 U/L (ref 39–117)
ALT: 14 U/L (ref 0–35)
AST: 17 U/L (ref 0–37)
Albumin: 4.1 g/dL (ref 3.5–5.2)
BILIRUBIN TOTAL: 1.2 mg/dL (ref 0.2–1.2)
BUN: 15 mg/dL (ref 6–23)
CALCIUM: 9.5 mg/dL (ref 8.4–10.5)
CO2: 26 mEq/L (ref 19–32)
CREATININE: 0.85 mg/dL (ref 0.40–1.20)
Chloride: 111 mEq/L (ref 96–112)
GFR: 68.69 mL/min (ref >60.00)
GLUCOSE: 110 mg/dL — AB (ref 70–99)
Potassium: 5.3 mEq/L — ABNORMAL HIGH (ref 3.5–5.1)
Sodium: 143 mEq/L (ref 135–145)
TOTAL PROTEIN: 6.2 g/dL (ref 6.0–8.3)

## 2018-06-28 LAB — TSH: TSH: 1.33 u[IU]/mL (ref 0.35–4.50)

## 2018-06-28 NOTE — Assessment & Plan Note (Signed)
Reviewed patient's  Do Not Resuscitate Orders which were made last year.  Patient continues to desire DNR status and has the order prominently displaced in the home.

## 2018-06-28 NOTE — Assessment & Plan Note (Signed)
She has no interest in starting medications,      Lab Results  Component Value Date   CHOL 171 06/28/2018   HDL 56.80 06/28/2018   LDLCALC 100 (H) 06/28/2018   LDLDIRECT 160.4 05/28/2013   TRIG 71.0 06/28/2018   CHOLHDL 3 06/28/2018

## 2018-06-28 NOTE — Assessment & Plan Note (Signed)

## 2018-07-01 ENCOUNTER — Other Ambulatory Visit: Payer: Self-pay | Admitting: Internal Medicine

## 2018-07-01 DIAGNOSIS — E875 Hyperkalemia: Secondary | ICD-10-CM

## 2018-12-25 ENCOUNTER — Telehealth: Payer: Self-pay

## 2018-12-25 NOTE — Telephone Encounter (Signed)
Copied from [place] [phone]. Topic: Appointment Scheduling - Scheduling Inquiry for Clinic >> Dec 25, 2018 10:01 AM Maria Christensen wrote: Reason for CRM: pt is calling to reschedule her nov physical. I called office 3x no answer  Pt called and rescheduled appointment for physical and asked me to remove a brother off her DPR, I removed the brother Radene Knee because he passed away Jun 14, 2018.  Albin Duckett,cma

## 2019-04-05 ENCOUNTER — Ambulatory Visit (INDEPENDENT_AMBULATORY_CARE_PROVIDER_SITE_OTHER): Payer: Medicare Other

## 2019-04-05 ENCOUNTER — Other Ambulatory Visit: Payer: Self-pay

## 2019-04-05 DIAGNOSIS — Z23 Encounter for immunization: Secondary | ICD-10-CM | POA: Diagnosis not present

## 2019-06-27 ENCOUNTER — Encounter: Payer: Medicare Other | Admitting: Internal Medicine

## 2019-06-28 ENCOUNTER — Other Ambulatory Visit (INDEPENDENT_AMBULATORY_CARE_PROVIDER_SITE_OTHER): Payer: Medicare Other

## 2019-06-28 ENCOUNTER — Other Ambulatory Visit: Payer: Self-pay

## 2019-06-28 DIAGNOSIS — E875 Hyperkalemia: Secondary | ICD-10-CM | POA: Diagnosis not present

## 2019-06-28 LAB — BASIC METABOLIC PANEL
BUN: 14 mg/dL (ref 6–23)
CO2: 27 mEq/L (ref 19–32)
Calcium: 9.5 mg/dL (ref 8.4–10.5)
Chloride: 110 mEq/L (ref 96–112)
Creatinine, Ser: 0.9 mg/dL (ref 0.40–1.20)
GFR: 60.35 mL/min (ref >60.00)
Glucose, Bld: 97 mg/dL (ref 70–99)
Potassium: 5.1 mEq/L (ref 3.5–5.1)
Sodium: 144 mEq/L (ref 135–145)

## 2019-07-01 ENCOUNTER — Encounter: Payer: Medicare Other | Admitting: Internal Medicine

## 2019-07-02 ENCOUNTER — Other Ambulatory Visit: Payer: Self-pay

## 2019-07-02 ENCOUNTER — Encounter: Payer: Self-pay | Admitting: Internal Medicine

## 2019-07-02 ENCOUNTER — Ambulatory Visit (INDEPENDENT_AMBULATORY_CARE_PROVIDER_SITE_OTHER): Payer: Medicare Other | Admitting: Internal Medicine

## 2019-07-02 VITALS — BP 128/82 | HR 65 | Temp 95.9°F | Resp 15 | Ht 64.5 in | Wt 134.8 lb

## 2019-07-02 DIAGNOSIS — G5790 Unspecified mononeuropathy of unspecified lower limb: Secondary | ICD-10-CM

## 2019-07-02 DIAGNOSIS — Z0000 Encounter for general adult medical examination without abnormal findings: Secondary | ICD-10-CM

## 2019-07-02 DIAGNOSIS — G629 Polyneuropathy, unspecified: Secondary | ICD-10-CM

## 2019-07-02 LAB — VITAMIN B12: Vitamin B-12: 177 pg/mL — ABNORMAL LOW (ref 211–911)

## 2019-07-02 LAB — HEMOGLOBIN A1C: Hgb A1c MFr Bld: 5.6 % (ref 4.6–6.5)

## 2019-07-02 LAB — TSH: TSH: 1.43 u[IU]/mL (ref 0.35–4.50)

## 2019-07-02 NOTE — Patient Instructions (Signed)
Health Maintenance After Age 79 After age 79, you are at a higher risk for certain long-term diseases and infections as well as injuries from falls. Falls are a major cause of broken bones and head injuries in people who are older than age 79. Getting regular preventive care can help to keep you healthy and well. Preventive care includes getting regular testing and making lifestyle changes as recommended by your health care provider. Talk with your health care provider about:  Which screenings and tests you should have. A screening is a test that checks for a disease when you have no symptoms.  A diet and exercise plan that is right for you. What should I know about screenings and tests to prevent falls? Screening and testing are the best ways to find a health problem early. Early diagnosis and treatment give you the best chance of managing medical conditions that are common after age 79. Certain conditions and lifestyle choices may make you more likely to have a fall. Your health care provider may recommend:  Regular vision checks. Poor vision and conditions such as cataracts can make you more likely to have a fall. If you wear glasses, make sure to get your prescription updated if your vision changes.  Medicine review. Work with your health care provider to regularly review all of the medicines you are taking, including over-the-counter medicines. Ask your health care provider about any side effects that may make you more likely to have a fall. Tell your health care provider if any medicines that you take make you feel dizzy or sleepy.  Osteoporosis screening. Osteoporosis is a condition that causes the bones to get weaker. This can make the bones weak and cause them to break more easily.  Blood pressure screening. Blood pressure changes and medicines to control blood pressure can make you feel dizzy.  Strength and balance checks. Your health care provider may recommend certain tests to check your  strength and balance while standing, walking, or changing positions.  Foot health exam. Foot pain and numbness, as well as not wearing proper footwear, can make you more likely to have a fall.  Depression screening. You may be more likely to have a fall if you have a fear of falling, feel emotionally low, or feel unable to do activities that you used to do.  Alcohol use screening. Using too much alcohol can affect your balance and may make you more likely to have a fall. What actions can I take to lower my risk of falls? General instructions  Talk with your health care provider about your risks for falling. Tell your health care provider if: ? You fall. Be sure to tell your health care provider about all falls, even ones that seem minor. ? You feel dizzy, sleepy, or off-balance.  Take over-the-counter and prescription medicines only as told by your health care provider. These include any supplements.  Eat a healthy diet and maintain a healthy weight. A healthy diet includes low-fat dairy products, low-fat (lean) meats, and fiber from whole grains, beans, and lots of fruits and vegetables. Home safety  Remove any tripping hazards, such as rugs, cords, and clutter.  Install safety equipment such as grab bars in bathrooms and safety rails on stairs.  Keep rooms and walkways well-lit. Activity   Follow a regular exercise program to stay fit. This will help you maintain your balance. Ask your health care provider what types of exercise are appropriate for you.  If you need a cane or   walker, use it as recommended by your health care provider.  Wear supportive shoes that have nonskid soles. Lifestyle  Do not drink alcohol if your health care provider tells you not to drink.  If you drink alcohol, limit how much you have: ? 0-1 drink a day for women. ? 0-2 drinks a day for men.  Be aware of how much alcohol is in your drink. In the [place] one drink equals one typical bottle of beer (12  oz), one-half glass of wine (5 oz), or one shot of hard liquor (1 oz).  Do not use any products that contain nicotine or tobacco, such as cigarettes and e-cigarettes. If you need help quitting, ask your health care provider. Summary  Having a healthy lifestyle and getting preventive care can help to protect your health and wellness after age 79.  Screening and testing are the best way to find a health problem early and help you avoid having a fall. Early diagnosis and treatment give you the best chance for managing medical conditions that are more common for people who are older than age 79.  Falls are a major cause of broken bones and head injuries in people who are older than age 79. Take precautions to prevent a fall at home.  Work with your health care provider to learn what changes you can make to improve your health and wellness and to prevent falls. This information is not intended to replace advice given to you by your health care provider. Make sure you discuss any questions you have with your health care provider. Document Released: 06/07/2017 Document Revised: 11/15/2018 Document Reviewed: 06/07/2017 Elsevier Patient Education  2020 [employer] Inc[employer]  

## 2019-07-02 NOTE — Assessment & Plan Note (Addendum)
Idiopathic , progressive ,  First noticed in 2013,  Not disrupting her sleep, sensation of feet being cold accompanied by cold feet on exam with intact pulses . Assessing for reversible causes

## 2019-07-02 NOTE — Progress Notes (Signed)
Patient ID: Maria Christensen, female    DOB: [birth year]  Age: 78 y.o. MRN: [id number]  The patient is here for annual preventive  examination and management of other chronic and acute problems.  This visit occurred during the SARS-CoV-2 public health emergency.  Safety protocols were in place, including screening questions prior to the visit, additional usage of staff PPE, and extensive cleaning of exam room while observing appropriate contact time as indicated for disinfecting solutions.    HM:    dexa normal in 2018   Repeat due in 2023  Continues to defer annual  mammogram   The risk factors are reflected in the social history.  The roster of all physicians providing medical care to patient - is listed in the Snapshot section of the chart.  Activities of daily living:  The patient is 100% independent in all ADLs: dressing, toileting, feeding as well as independent mobility  Home safety : The patient has smoke detectors in the home. They wear seatbelts.  There are no firearms at home. There is no violence in the home.   There is no risks for hepatitis, STDs or HIV. There is no   history of blood transfusion. They have no travel history to infectious disease endemic areas of the world.  The patient has seen their dentist in the last six month. They have seen their eye doctor in the last year. She denies slight hearing difficulty with regard to whispered voices and some television programs. She has have deferred audiologic testing in the last year.  She does  not  have excessive sun exposure. Discussed the need for sun protection: hats, long sleeves and use of sunscreen if there is significant sun exposure.   Diet: the importance of a healthy diet is discussed. They do have a healthy diet.  The benefits of regular aerobic exercise were discussed. She walks 4 times per week ,  20 minutes.   Depression screen: there are no signs or vegative symptoms of depression- irritability, change in  appetite, anhedonia, sadness/tearfullness.  Cognitive assessment: the patient manages all their financial and personal affairs and is actively engaged. They could relate day,date,year and events; recalled 2/3 objects at 3 minutes; performed clock-face test normally.  The following portions of the patient's history were reviewed and updated as appropriate: allergies, current medications, past family history, past medical history,  past surgical history, past social history  and problem list.  Visual acuity was not assessed per patient preference since she has regular follow up with her ophthalmologist. Hearing and body mass index were assessed and reviewed.   During the course of the visit the patient was educated and counseled about appropriate screening and preventive services including : fall prevention , diabetes screening, nutrition counseling, colorectal cancer screening, and recommended immunizations.    CC: The primary encounter diagnosis was Neuropathy. Diagnoses of Neuropathy of foot, unspecified laterality and Encounter for preventive health examination were also pertinent to this visit.  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   She has a past surgical history that includes Abdominal hysterectomy; Bunionectomy (2002); Cleft palate repair; and Cataract extraction w/PHACO (Left, 04/01/2015).   Her family history includes Cancer in her father; Diabetes in her brother; Glaucoma (age of onset: 63) in her brother; Heart attack in her brother; Hypertension in her  brother; Mental illness in her mother; Osteoporosis in her mother.She reports that she quit smoking about 57 years ago. She has never used smokeless tobacco. She reports that she does not drink alcohol or use drugs.  Outpatient  Medications Prior to Visit  Medication Sig Dispense Refill  . brimonidine (ALPHAGAN) 0.2 % ophthalmic solution Instill 1 drop into both eyes twice a day  Take 1 drop(s) in both eyes 2 times a day for 30 days    . dorzolamide-timolol (COSOPT) 22.3-6.8 MG/ML ophthalmic solution Place 1 drop into both eyes 2 (two) times daily.     Marland Kitchen LUMIGAN 0.01 % SOLN Place 1 drop into both eyes at bedtime.     . multivitamin-lutein (OCUVITE-LUTEIN) CAPS Take 1 capsule by mouth daily.  0  . ALPHAGAN P 0.1 % SOLN Place 1 drop into both eyes 2 (two) times daily.      No facility-administered medications prior to visit.     Review of Systems  Patient denies headache, fevers, malaise, unintentional weight loss, skin rash, eye pain, sinus congestion and sinus pain, sore throat, dysphagia,  hemoptysis , cough, dyspnea, wheezing, chest pain, palpitations, orthopnea, edema, abdominal pain, nausea, melena, diarrhea, constipation, flank pain, dysuria, hematuria, urinary  Frequency, nocturia, numbness, tingling, seizures,  Focal weakness, Loss of consciousness,  Tremor, insomnia, depression, anxiety, and suicidal ideation.     Objective:  BP 128/82 (BP Location: Right Arm, Patient Position: Sitting, Cuff Size: Normal)   Pulse 65   Temp (!) 95.9 F (35.5 C) (Temporal)   Resp 15   Ht 5' 4.5" (1.638 m)   Wt 134 lb 12.8 oz (61.1 kg)   SpO2 99%   BMI 22.78 kg/m   Physical Exam   General appearance: alert, cooperative and appears stated age Ears: normal TM's and external ear canals both ears Throat: lips, mucosa, and tongue normal; teeth and gums normal Neck: no adenopathy, no carotid bruit, supple, symmetrical, trachea midline and thyroid not enlarged, symmetric, no tenderness/mass/nodules Back: symmetric, no curvature. ROM normal. No CVA tenderness. Lungs: clear to auscultation bilaterally Breast exam: deferred by patient  Heart: regular rate and rhythm, S1, S2 normal, no murmur, click, rub or gallop Abdomen:  soft, non-tender; bowel sounds normal; no masses,  no organomegaly Pulses: 2+ and symmetric Skin: Skin color, texture, turgor normal. No rashes or lesions Lymph nodes: Cervical, supraclavicular, and axillary nodes normal.    Assessment & Plan:   Problem List Items Addressed This Visit      Unprioritized   Neuropathy of foot    Idiopathic , progressive ,  First noticed in 2013,  Not disrupting her sleep, sensation of feet being cold accompanied by cold feet on exam with intact pulses . Assessing for reversible causes       Encounter for preventive health examination    age appropriate education and counseling updated, referrals for preventative services and immunizations addressed, dietary and smoking counseling addressed, most recent labs reviewed.  I have personally reviewed and have noted:  1) the patient's medical and social history 2) The pt's use of alcohol, tobacco, and illicit drugs 3) The patient's current medications and supplements 4) Functional ability including ADL's, fall risk, home safety risk, hearing and visual impairment 5) Diet and physical activities 6) Evidence for depression or mood disorder 7) The patient's height, weight, and BMI have been recorded in the chart  I have made referrals, and provided counseling and education based on review of the above       Other  Visit Diagnoses    Neuropathy    -  Primary   Relevant Orders   Vitamin B12 (Completed)   Hemoglobin A1c (Completed)   RPR (Completed)   TSH (Completed)      I have discontinued Maricela Bo. Fantroy's Alphagan P. I am also having her maintain her dorzolamide-timolol, multivitamin-lutein, Lumigan, and brimonidine.  No orders of the defined types were placed in this encounter.   Medications Discontinued During This Encounter  Medication Reason  . ALPHAGAN P 0.1 % SOLN Change in therapy    Follow-up: No follow-ups on file.   Crecencio Mc, MD

## 2019-07-03 LAB — RPR: RPR Ser Ql: NONREACTIVE

## 2019-07-04 NOTE — Assessment & Plan Note (Signed)

## 2019-07-16 ENCOUNTER — Other Ambulatory Visit: Payer: Self-pay

## 2019-07-16 ENCOUNTER — Ambulatory Visit (INDEPENDENT_AMBULATORY_CARE_PROVIDER_SITE_OTHER): Payer: Medicare Other

## 2019-07-16 DIAGNOSIS — E538 Deficiency of other specified B group vitamins: Secondary | ICD-10-CM | POA: Diagnosis not present

## 2019-07-16 MED ORDER — CYANOCOBALAMIN 1000 MCG/ML IJ SOLN
1000.0000 ug | Freq: Once | INTRAMUSCULAR | Status: AC
  Administered 2019-07-16: 11:00:00 1000 ug via INTRAMUSCULAR

## 2019-07-16 NOTE — Progress Notes (Addendum)
Pt presents today for b12 injection. Right deltoid, IM. Pt tolerated well.   Reviewed.  Dr Nicki Reaper

## 2019-07-19 ENCOUNTER — Telehealth: Payer: Self-pay

## 2019-07-19 NOTE — Telephone Encounter (Signed)
Updated medication list per pt mychart message.

## 2019-07-21 LAB — INTRINSIC FACTOR ANTIBODIES: Intrinsic Factor: POSITIVE — AB

## 2019-07-23 ENCOUNTER — Ambulatory Visit (INDEPENDENT_AMBULATORY_CARE_PROVIDER_SITE_OTHER): Payer: Medicare Other

## 2019-07-23 ENCOUNTER — Other Ambulatory Visit: Payer: Self-pay

## 2019-07-23 VITALS — Temp 96.0°F

## 2019-07-23 DIAGNOSIS — E538 Deficiency of other specified B group vitamins: Secondary | ICD-10-CM

## 2019-07-23 MED ORDER — CYANOCOBALAMIN 1000 MCG/ML IJ SOLN
1000.0000 ug | Freq: Once | INTRAMUSCULAR | Status: AC
  Administered 2019-07-23: 10:00:00 1000 ug via INTRAMUSCULAR

## 2019-07-23 NOTE — Progress Notes (Signed)
Patient came in today for B-12 injection in left deltoid. Patient tolerated well.

## 2019-07-30 ENCOUNTER — Ambulatory Visit: Payer: Medicare Other | Admitting: *Deleted

## 2019-07-30 ENCOUNTER — Other Ambulatory Visit: Payer: Self-pay

## 2019-08-20 ENCOUNTER — Ambulatory Visit: Payer: Medicare Other | Attending: Internal Medicine

## 2019-08-20 DIAGNOSIS — Z23 Encounter for immunization: Secondary | ICD-10-CM

## 2019-08-20 NOTE — Progress Notes (Signed)
   Covid-19 Vaccination Clinic  Name:  Maria Christensen    MRN: [id number] DOB: March 25, 1941  08/20/2019  [name] was observed post Covid-19 immunization for 15 minutes without incidence. She was provided with Vaccine Information Sheet and instruction to access the V-Safe system.   [name] was instructed to call 911 with any severe reactions post vaccine: Marland Kitchen Difficulty breathing  . Swelling of your face and throat  . A fast heartbeat  . A bad rash all over your body  . Dizziness and weakness    Immunizations Administered    Name Date Dose VIS Date Route   Pfizer COVID-19 Vaccine 08/20/2019  9:39 AM 0.3 mL 07/19/2019 Intramuscular   Manufacturer: [employer], [employer]   Lot: [id number]   [place]: [id number]

## 2019-08-22 ENCOUNTER — Other Ambulatory Visit: Payer: Self-pay

## 2019-08-27 ENCOUNTER — Ambulatory Visit (INDEPENDENT_AMBULATORY_CARE_PROVIDER_SITE_OTHER): Payer: Medicare Other

## 2019-08-27 ENCOUNTER — Other Ambulatory Visit: Payer: Self-pay

## 2019-08-27 VITALS — Temp 95.2°F

## 2019-08-27 DIAGNOSIS — E538 Deficiency of other specified B group vitamins: Secondary | ICD-10-CM

## 2019-08-27 MED ORDER — CYANOCOBALAMIN 1000 MCG/ML IJ SOLN
1000.0000 ug | Freq: Once | INTRAMUSCULAR | Status: AC
  Administered 2019-08-27: 1000 ug via INTRAMUSCULAR

## 2019-08-27 NOTE — Progress Notes (Addendum)
Patient came in today for B-12 injection in left deltoid, IM. Pt requested left due to just getting Covid vaccine in right arm. Pt tolerated well.   Reviewed.  Dr Nicki Reaper

## 2019-09-06 MED ORDER — ALPHAGAN P 0.1 % OP SOLN: 1.0000 [drp] | Freq: Two times a day (BID) | OPHTHALMIC | 0 refills | Status: DC

## 2019-09-09 ENCOUNTER — Ambulatory Visit: Payer: Medicare PPO | Attending: Internal Medicine

## 2019-09-09 DIAGNOSIS — Z23 Encounter for immunization: Secondary | ICD-10-CM | POA: Insufficient documentation

## 2019-09-09 NOTE — Progress Notes (Signed)
   Covid-19 Vaccination Clinic  Name:  Maria Christensen    MRN: [id number] DOB: April 04, 1941  09/09/2019  [name] was observed post Covid-19 immunization for 15 minutes without incidence. She was provided with Vaccine Information Sheet and instruction to access the V-Safe system.   [name] was instructed to call 911 with any severe reactions post vaccine: Marland Kitchen Difficulty breathing  . Swelling of your face and throat  . A fast heartbeat  . A bad rash all over your body  . Dizziness and weakness    Immunizations Administered    Name Date Dose VIS Date Route   Pfizer COVID-19 Vaccine 09/09/2019  8:32 AM 0.3 mL 07/19/2019 Intramuscular   Manufacturer: [place]   Lot: [id number]   [place]: [id number]

## 2019-09-17 DIAGNOSIS — M79672 Pain in left foot: Secondary | ICD-10-CM | POA: Diagnosis not present

## 2019-09-17 DIAGNOSIS — M722 Plantar fascial fibromatosis: Secondary | ICD-10-CM | POA: Diagnosis not present

## 2019-10-01 ENCOUNTER — Ambulatory Visit (INDEPENDENT_AMBULATORY_CARE_PROVIDER_SITE_OTHER): Payer: Medicare PPO

## 2019-10-01 ENCOUNTER — Other Ambulatory Visit: Payer: Self-pay

## 2019-10-01 DIAGNOSIS — E538 Deficiency of other specified B group vitamins: Secondary | ICD-10-CM | POA: Diagnosis not present

## 2019-10-01 MED ORDER — CYANOCOBALAMIN 1000 MCG/ML IJ SOLN
1000.0000 ug | Freq: Once | INTRAMUSCULAR | Status: AC
  Administered 2019-10-01: 08:00:00 1000 ug via INTRAMUSCULAR

## 2019-10-01 NOTE — Progress Notes (Signed)
Patient presented for B 12 injection to right deltoid, patient voiced no concerns nor showed any signs of distress during injection. 

## 2019-10-08 DIAGNOSIS — M722 Plantar fascial fibromatosis: Secondary | ICD-10-CM | POA: Diagnosis not present

## 2019-10-08 DIAGNOSIS — M79672 Pain in left foot: Secondary | ICD-10-CM | POA: Diagnosis not present

## 2019-10-30 ENCOUNTER — Ambulatory Visit (INDEPENDENT_AMBULATORY_CARE_PROVIDER_SITE_OTHER): Payer: Medicare PPO | Admitting: *Deleted

## 2019-10-30 ENCOUNTER — Other Ambulatory Visit: Payer: Self-pay

## 2019-10-30 DIAGNOSIS — E538 Deficiency of other specified B group vitamins: Secondary | ICD-10-CM | POA: Diagnosis not present

## 2019-10-30 MED ORDER — CYANOCOBALAMIN 1000 MCG/ML IJ SOLN
1000.0000 ug | Freq: Once | INTRAMUSCULAR | Status: AC
  Administered 2019-10-30: 15:00:00 1000 ug via INTRAMUSCULAR

## 2019-10-30 NOTE — Progress Notes (Signed)
Patient presented for B 12 injection to left deltoid, patient voiced no concerns nor showed any signs of distress during injection. 

## 2019-12-05 ENCOUNTER — Other Ambulatory Visit: Payer: Self-pay

## 2019-12-05 ENCOUNTER — Ambulatory Visit (INDEPENDENT_AMBULATORY_CARE_PROVIDER_SITE_OTHER): Payer: Medicare PPO

## 2019-12-05 DIAGNOSIS — E538 Deficiency of other specified B group vitamins: Secondary | ICD-10-CM | POA: Diagnosis not present

## 2019-12-05 MED ORDER — CYANOCOBALAMIN 1000 MCG/ML IJ SOLN
1000.0000 ug | Freq: Once | INTRAMUSCULAR | Status: AC
  Administered 2019-12-05: 1000 ug via INTRAMUSCULAR

## 2019-12-05 NOTE — Progress Notes (Signed)
Patient presented for B 12 injection to right deltoid, patient voiced no concerns nor showed any signs of distress during injection. 

## 2019-12-24 DIAGNOSIS — H401133 Primary open-angle glaucoma, bilateral, severe stage: Secondary | ICD-10-CM | POA: Diagnosis not present

## 2020-01-02 ENCOUNTER — Ambulatory Visit: Payer: Medicare PPO

## 2020-01-07 ENCOUNTER — Ambulatory Visit (INDEPENDENT_AMBULATORY_CARE_PROVIDER_SITE_OTHER): Payer: Medicare PPO

## 2020-01-07 ENCOUNTER — Other Ambulatory Visit: Payer: Self-pay

## 2020-01-07 DIAGNOSIS — E538 Deficiency of other specified B group vitamins: Secondary | ICD-10-CM | POA: Diagnosis not present

## 2020-01-07 MED ORDER — CYANOCOBALAMIN 1000 MCG/ML IJ SOLN
1000.0000 ug | Freq: Once | INTRAMUSCULAR | Status: AC
  Administered 2020-01-07: 1000 ug via INTRAMUSCULAR

## 2020-01-07 NOTE — Progress Notes (Signed)
Patient presented for B 12 injection to left deltoid, patient voiced no concerns nor showed any signs of distress during injection. 

## 2020-02-06 ENCOUNTER — Other Ambulatory Visit: Payer: Self-pay

## 2020-02-06 ENCOUNTER — Ambulatory Visit (INDEPENDENT_AMBULATORY_CARE_PROVIDER_SITE_OTHER): Payer: Medicare PPO

## 2020-02-06 DIAGNOSIS — E538 Deficiency of other specified B group vitamins: Secondary | ICD-10-CM

## 2020-02-06 MED ORDER — CYANOCOBALAMIN 1000 MCG/ML IJ SOLN
1000.0000 ug | Freq: Once | INTRAMUSCULAR | Status: AC
  Administered 2020-02-06: 1000 ug via INTRAMUSCULAR

## 2020-02-06 NOTE — Progress Notes (Signed)
Patient presented for B 12 injection to right deltoid, patient voiced no concerns nor showed any signs of distress during injection. 

## 2020-03-10 ENCOUNTER — Ambulatory Visit (INDEPENDENT_AMBULATORY_CARE_PROVIDER_SITE_OTHER): Payer: Medicare PPO

## 2020-03-10 ENCOUNTER — Other Ambulatory Visit: Payer: Self-pay

## 2020-03-10 DIAGNOSIS — E538 Deficiency of other specified B group vitamins: Secondary | ICD-10-CM | POA: Diagnosis not present

## 2020-03-10 MED ORDER — CYANOCOBALAMIN 1000 MCG/ML IJ SOLN
1000.0000 ug | Freq: Once | INTRAMUSCULAR | Status: AC
  Administered 2020-03-10: 1000 ug via INTRAMUSCULAR

## 2020-03-10 NOTE — Progress Notes (Signed)
Patient presented for B 12 injection to left deltoid, patient voiced no concerns nor showed any signs of distress during injection. 

## 2020-04-06 DIAGNOSIS — H90A31 Mixed conductive and sensorineural hearing loss, unilateral, right ear with restricted hearing on the contralateral side: Secondary | ICD-10-CM | POA: Diagnosis not present

## 2020-04-15 ENCOUNTER — Ambulatory Visit (INDEPENDENT_AMBULATORY_CARE_PROVIDER_SITE_OTHER): Payer: Medicare PPO

## 2020-04-15 ENCOUNTER — Other Ambulatory Visit: Payer: Self-pay

## 2020-04-15 DIAGNOSIS — E538 Deficiency of other specified B group vitamins: Secondary | ICD-10-CM | POA: Diagnosis not present

## 2020-04-15 DIAGNOSIS — Z23 Encounter for immunization: Secondary | ICD-10-CM

## 2020-04-15 MED ORDER — CYANOCOBALAMIN 1000 MCG/ML IJ SOLN
1000.0000 ug | Freq: Once | INTRAMUSCULAR | Status: AC
  Administered 2020-04-15: 1000 ug via INTRAMUSCULAR

## 2020-04-15 NOTE — Progress Notes (Signed)
Patient presented for B 12 injection to right deltoid, patient voiced no concerns nor showed any signs of distress during injection. 

## 2020-04-16 DIAGNOSIS — H903 Sensorineural hearing loss, bilateral: Secondary | ICD-10-CM | POA: Diagnosis not present

## 2020-05-19 ENCOUNTER — Other Ambulatory Visit: Payer: Self-pay

## 2020-05-19 ENCOUNTER — Ambulatory Visit (INDEPENDENT_AMBULATORY_CARE_PROVIDER_SITE_OTHER): Payer: Medicare PPO

## 2020-05-19 DIAGNOSIS — E538 Deficiency of other specified B group vitamins: Secondary | ICD-10-CM | POA: Diagnosis not present

## 2020-05-19 MED ORDER — CYANOCOBALAMIN 1000 MCG/ML IJ SOLN
1000.0000 ug | Freq: Once | INTRAMUSCULAR | Status: AC
  Administered 2020-05-19: 1000 ug via INTRAMUSCULAR

## 2020-05-19 NOTE — Progress Notes (Addendum)
Patient presented for B 12 injection to left deltoid, patient voiced no concerns nor showed any signs of distress during injection.  Reviewed.  Dr Scott 

## 2020-06-24 ENCOUNTER — Ambulatory Visit (INDEPENDENT_AMBULATORY_CARE_PROVIDER_SITE_OTHER): Payer: Medicare PPO

## 2020-06-24 ENCOUNTER — Other Ambulatory Visit: Payer: Self-pay

## 2020-06-24 DIAGNOSIS — E538 Deficiency of other specified B group vitamins: Secondary | ICD-10-CM | POA: Diagnosis not present

## 2020-06-24 MED ORDER — CYANOCOBALAMIN 1000 MCG/ML IJ SOLN
1000.0000 ug | Freq: Once | INTRAMUSCULAR | Status: AC
  Administered 2020-06-24: 1000 ug via INTRAMUSCULAR

## 2020-06-24 NOTE — Progress Notes (Signed)
Patient presented for B 12 injection to right deltoid, patient voiced no concerns nor showed any signs of distress during injection. 

## 2020-06-26 DIAGNOSIS — H401133 Primary open-angle glaucoma, bilateral, severe stage: Secondary | ICD-10-CM | POA: Diagnosis not present

## 2020-07-08 DIAGNOSIS — H90A31 Mixed conductive and sensorineural hearing loss, unilateral, right ear with restricted hearing on the contralateral side: Secondary | ICD-10-CM | POA: Diagnosis not present

## 2020-07-09 ENCOUNTER — Other Ambulatory Visit: Payer: Self-pay

## 2020-07-09 ENCOUNTER — Encounter: Payer: Self-pay | Admitting: Internal Medicine

## 2020-07-09 ENCOUNTER — Ambulatory Visit (INDEPENDENT_AMBULATORY_CARE_PROVIDER_SITE_OTHER): Payer: Medicare PPO | Admitting: Internal Medicine

## 2020-07-09 VITALS — BP 138/82 | HR 75 | Temp 98.1°F | Resp 15 | Ht 64.0 in | Wt 139.8 lb

## 2020-07-09 DIAGNOSIS — H4010X0 Unspecified open-angle glaucoma, stage unspecified: Secondary | ICD-10-CM

## 2020-07-09 DIAGNOSIS — E785 Hyperlipidemia, unspecified: Secondary | ICD-10-CM | POA: Diagnosis not present

## 2020-07-09 DIAGNOSIS — Z789 Other specified health status: Secondary | ICD-10-CM | POA: Diagnosis not present

## 2020-07-09 DIAGNOSIS — Z0000 Encounter for general adult medical examination without abnormal findings: Secondary | ICD-10-CM | POA: Diagnosis not present

## 2020-07-09 DIAGNOSIS — E538 Deficiency of other specified B group vitamins: Secondary | ICD-10-CM | POA: Insufficient documentation

## 2020-07-09 LAB — CBC WITH DIFFERENTIAL/PLATELET
Basophils Absolute: 0.1 10*3/uL (ref 0.0–0.1)
Basophils Relative: 0.9 % (ref 0.0–3.0)
Eosinophils Absolute: 0.1 10*3/uL (ref 0.0–0.7)
Eosinophils Relative: 2.2 % (ref 0.0–5.0)
HCT: 46.9 % — ABNORMAL HIGH (ref 36.0–46.0)
Hemoglobin: 15.3 g/dL — ABNORMAL HIGH (ref 12.0–15.0)
Lymphocytes Relative: 19 % (ref 12.0–46.0)
Lymphs Abs: 1.1 10*3/uL (ref 0.7–4.0)
MCHC: 32.7 g/dL (ref 30.0–36.0)
MCV: 92.6 fl (ref 78.0–100.0)
Monocytes Absolute: 0.6 10*3/uL (ref 0.1–1.0)
Monocytes Relative: 10 % (ref 3.0–12.0)
Neutro Abs: 4.1 10*3/uL (ref 1.4–7.7)
Neutrophils Relative %: 67.9 % (ref 43.0–77.0)
Platelets: 192 10*3/uL (ref 150.0–400.0)
RBC: 5.06 Mil/uL (ref 3.87–5.11)
RDW: 14 % (ref 11.5–15.5)
WBC: 6 10*3/uL (ref 4.0–10.5)

## 2020-07-09 LAB — LIPID PANEL
Cholesterol: 232 mg/dL — ABNORMAL HIGH (ref 0–200)
HDL: 56.8 mg/dL (ref >39.00)
LDL Cholesterol: 147 mg/dL — ABNORMAL HIGH (ref 0–99)
NonHDL: 175.63
Total CHOL/HDL Ratio: 4
Triglycerides: 142 mg/dL (ref 0.0–149.0)
VLDL: 28.4 mg/dL (ref 0.0–40.0)

## 2020-07-09 LAB — COMPREHENSIVE METABOLIC PANEL
ALT: 11 U/L (ref 0–35)
AST: 15 U/L (ref 0–37)
Albumin: 4.5 g/dL (ref 3.5–5.2)
Alkaline Phosphatase: 68 U/L (ref 39–117)
BUN: 15 mg/dL (ref 6–23)
CO2: 27 mEq/L (ref 19–32)
Calcium: 9.5 mg/dL (ref 8.4–10.5)
Chloride: 110 mEq/L (ref 96–112)
Creatinine, Ser: 1.07 mg/dL (ref 0.40–1.20)
GFR: 49.11 mL/min — ABNORMAL LOW (ref >60.00)
Glucose, Bld: 118 mg/dL — ABNORMAL HIGH (ref 70–99)
Potassium: 5.7 mEq/L — ABNORMAL HIGH (ref 3.5–5.1)
Sodium: 142 mEq/L (ref 135–145)
Total Bilirubin: 1.9 mg/dL — ABNORMAL HIGH (ref 0.2–1.2)
Total Protein: 6.7 g/dL (ref 6.0–8.3)

## 2020-07-09 LAB — TSH: TSH: 1.38 u[IU]/mL (ref 0.35–4.50)

## 2020-07-09 LAB — VITAMIN B12: Vitamin B-12: 600 pg/mL (ref 211–911)

## 2020-07-09 MED ORDER — CYANOCOBALAMIN 1000 MCG SL SUBL: 1.0000 | SUBLINGUAL_TABLET | Freq: Every day | SUBLINGUAL | 1 refills | Status: DC

## 2020-07-09 MED ORDER — TETANUS-DIPHTH-ACELL PERTUSSIS 5-2.5-18.5 LF-MCG/0.5 IM SUSY: 0.5000 mL | PREFILLED_SYRINGE | Freq: Once | INTRAMUSCULAR | 0 refills | Status: AC

## 2020-07-09 NOTE — Patient Instructions (Addendum)
1) switching to sublingual b12 daily,   Stop the injections,  And we will  recheck B12 level   In 3 months    Vitamin B12 Deficiency Vitamin B12 deficiency occurs when the body does not have enough vitamin B12, which is an important vitamin. The body needs this vitamin:  To make red blood cells.  To make DNA. This is the genetic material inside cells.  To help the nerves work properly so they can carry messages from the brain to the body. Vitamin B12 deficiency can cause various health problems, such as a low red blood cell count (anemia) or nerve damage. What are the causes? This condition may be caused by:  Not eating enough foods that contain vitamin B12.  Not having enough stomach acid and digestive fluids to properly absorb vitamin B12 from the food that you eat.  Certain digestive system diseases that make it hard to absorb vitamin B12. These diseases include Crohn's disease, chronic pancreatitis, and cystic fibrosis.  A condition in which the body does not make enough of a protein (intrinsic factor), resulting in too few red blood cells (pernicious anemia).  Having a surgery in which part of the stomach or small intestine is removed.  Taking certain medicines that make it hard for the body to absorb vitamin B12. These medicines include: ? Heartburn medicines (antacids and proton pump inhibitors). ? Certain antibiotic medicines. ? Some medicines that are used to treat diabetes, tuberculosis, gout, or high cholesterol. What increases the risk? The following factors may make you more likely to develop a B12 deficiency:  Being older than age 34.  Eating a vegetarian or vegan diet, especially while you are pregnant.  Eating a poor diet while you are pregnant.  Taking certain medicines.  Having alcoholism. What are the signs or symptoms? In some cases, there are no symptoms of this condition. If the condition leads to anemia or nerve damage, various symptoms can occur,  such as:  Weakness.  Fatigue.  Loss of appetite.  Weight loss.  Numbness or tingling in your hands and feet.  Redness and burning of the tongue.  Confusion or memory problems.  Depression.  Sensory problems, such as color blindness, ringing in the ears, or loss of taste.  Diarrhea or constipation.  Trouble walking. If anemia is severe, symptoms can include:  Shortness of breath.  Dizziness.  Rapid heart rate (tachycardia). How is this diagnosed? This condition may be diagnosed with a blood test to measure the level of vitamin B12 in your blood. You may also have other tests, including:  A group of tests that measure certain characteristics of blood cells (complete blood count, CBC).  A blood test to measure intrinsic factor.  A procedure where a thin tube with a camera on the end is used to look into your stomach or intestines (endoscopy). Other tests may be needed to discover the cause of B12 deficiency. How is this treated? Treatment for this condition depends on the cause. This condition may be treated by:  Changing your eating and drinking habits, such as: ? Eating more foods that contain vitamin B12. ? Drinking less alcohol or no alcohol.  Getting vitamin B12 injections.  Taking vitamin B12 supplements. Your health care provider will tell you which dosage is best for you. Follow these instructions at home: Eating and drinking   Eat lots of healthy foods that contain vitamin B12, including: ? Meats and poultry. This includes beef, pork, chicken, [place], and organ meats, such as  liver. ? Seafood. This includes clams, rainbow trout, salmon, tuna, and haddock. ? Eggs. ? Cereal and dairy products that are fortified. This means that vitamin B12 has been added to the food. Check the label on the package to see if the food is fortified. The items listed above may not be a complete list of recommended foods and beverages. Contact a dietitian for more  information. General instructions  Get any injections that are prescribed by your health care provider.  Take supplements only as told by your health care provider. Follow the directions carefully.  Do not drink alcohol if your health care provider tells you not to. In some cases, you may only be asked to limit alcohol use.  Keep all follow-up visits as told by your health care provider. This is important. Contact a health care provider if:  Your symptoms come back. Get help right away if you:  Develop shortness of breath.  Have a rapid heart rate.  Have chest pain.  Become dizzy or lose consciousness. Summary  Vitamin B12 deficiency occurs when the body does not have enough vitamin B12.  The main causes of vitamin B12 deficiency include dietary deficiency, digestive diseases, pernicious anemia, and having a surgery in which part of the stomach or small intestine is removed.  In some cases, there are no symptoms of this condition. If the condition leads to anemia or nerve damage, various symptoms can occur, such as weakness, shortness of breath, and numbness.  Treatment may include getting vitamin B12 injections or taking vitamin B12 supplements. Eat lots of healthy foods that contain vitamin B12. This information is not intended to replace advice given to you by your health care provider. Make sure you discuss any questions you have with your health care provider. Document Revised: 01/11/2019 Document Reviewed: 04/03/2018 Elsevier Patient Education  2020 [employer].

## 2020-07-09 NOTE — Assessment & Plan Note (Addendum)
Switching to sublingual trial  per patient preference .  Will recheck level after 3 months  

## 2020-07-09 NOTE — Assessment & Plan Note (Signed)

## 2020-07-11 NOTE — Progress Notes (Signed)
Patient ID: [name], female    DOB: Jan 26, 1941  Age: 79 y.o. MRN: [id number]  The patient is here for annual preventive examination and management of other chronic and acute problems.   The risk factors are reflected in the social history.  The roster of all physicians providing medical care to patient - is listed in the Snapshot section of the chart.  Activities of daily living:  The patient is 100% independent in all ADLs: dressing, toileting, feeding as well as independent mobility  Home safety : The patient has smoke detectors in the home. They wear seatbelts.  There are no firearms at home. There is no violence in the home.   There is no risks for hepatitis, STDs or HIV. There is no   history of blood transfusion. They have no travel history to infectious disease endemic areas of the world.  The patient has seen their dentist in the last six month. They have seen their eye doctor in the last year. They admit to slight hearing difficulty with regard to whispered voices and some television programs.  They have deferred audiologic testing in the last year.  They do not  have excessive sun exposure. Discussed the need for sun protection: hats, long sleeves and use of sunscreen if there is significant sun exposure.   Diet: the importance of a healthy diet is discussed. They do have a healthy diet.  The benefits of regular aerobic exercise were discussed. She walks 4 times per week ,  20 minutes.   Depression screen: there are no signs or vegative symptoms of depression- irritability, change in appetite, anhedonia, sadness/tearfullness.  Cognitive assessment: the patient manages all their financial and personal affairs and is actively engaged. They could relate day,date,year and events; recalled 2/3 objects at 3 minutes; performed clock-face test normally.  The following portions of the patient's history were reviewed and updated as appropriate: allergies, current medications, past  family history, past medical history,  past surgical history, past social history  and problem list.  Visual acuity was not assessed per patient preference since she has regular follow up with her ophthalmologist. Hearing and body mass index were assessed and reviewed.   During the course of the visit the patient was educated and counseled about appropriate screening and preventive services including : fall prevention , diabetes screening, nutrition counseling, colorectal cancer screening, and recommended immunizations.    CC: The primary encounter diagnosis was Encounter for preventive health examination. Diagnoses of B12 deficiency, Hyperlipidemia with target LDL less than 160, Open-angle glaucoma, unspecified glaucoma stage, unspecified laterality, unspecified open-angle glaucoma type, and Active advance directive on file were also pertinent to this visit.  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   She has a past surgical history that includes Abdominal hysterectomy; Bunionectomy (2002); Cleft palate repair; and Cataract extraction w/PHACO (Left, 04/01/2015).   Her family history includes Cancer in her father; Diabetes in her brother; Glaucoma (age of onset: 22) in her brother; Heart attack in her brother; Hypertension in her brother; Mental illness in her mother; Osteoporosis in her mother.She reports that she quit smoking about 58 years ago. She has never used smokeless tobacco. She reports that she does not drink alcohol and does not use drugs.  Outpatient Medications Prior to Visit  Medication Sig Dispense Refill  . ALPHAGAN P 0.1 % SOLN Place 1 drop into both eyes 2 (two) times  daily. 5 mL 0  . dorzolamide-timolol (COSOPT) 22.3-6.8 MG/ML ophthalmic solution Place 1 drop into both eyes 2 (two) times daily.     Marland Kitchen LUMIGAN 0.01 % SOLN Place 1 drop into both  eyes at bedtime.     . Multiple Vitamins-Minerals (EYE MULTIVITAMIN/LUTEIN) TABS Take 1 tablet by mouth daily.    . cyanocobalamin (,VITAMIN B-12,) 1000 MCG/ML injection Inject into the muscle every 30 (thirty) days.     No facility-administered medications prior to visit.    Review of Systems   Patient denies headache, fevers, malaise, unintentional weight loss, skin rash, eye pain, sinus congestion and sinus pain, sore throat, dysphagia,  hemoptysis , cough, dyspnea, wheezing, chest pain, palpitations, orthopnea, edema, abdominal pain, nausea, melena, diarrhea, constipation, flank pain, dysuria, hematuria, urinary  Frequency, nocturia, numbness, tingling, seizures,  Focal weakness, Loss of consciousness,  Tremor, insomnia, depression, anxiety, and suicidal ideation.     Objective:  BP 138/82 (BP Location: Left Arm, Patient Position: Sitting, Cuff Size: Normal)   Pulse 75   Temp 98.1 F (36.7 C) (Oral)   Resp 15   Ht 5\' 4"  (1.626 m)   Wt 139 lb 12.8 oz (63.4 kg)   SpO2 99%   BMI 24.00 kg/m   Physical Exam  General appearance: alert, cooperative and appears stated age Head: Normocephalic, without obvious abnormality, atraumatic Eyes: conjunctivae/corneas clear. PERRL, EOM's intact. Fundi benign. Ears: normal TM's and external ear canals both ears Nose: Nares normal. Septum midline. Mucosa normal. No drainage or sinus tenderness. Throat: lips, mucosa, and tongue normal; teeth and gums normal Neck: no adenopathy, no carotid bruit, no JVD, supple, symmetrical, trachea midline and thyroid not enlarged, symmetric, no tenderness/mass/nodules Lungs: clear to auscultation bilaterally Breasts: normal appearance, no masses or tenderness Heart: regular rate and rhythm, S1, S2 normal, no murmur, click, rub or gallop Abdomen: soft, non-tender; bowel sounds normal; no masses,  no organomegaly Extremities: extremities normal, atraumatic, no cyanosis or edema Pulses: 2+ and symmetric Skin:  Skin color, texture, turgor normal. No rashes or lesions Neurologic: Alert and oriented X 3, normal strength and tone. Normal symmetric reflexes. Normal coordination and gait.    Assessment & Plan:   Problem List Items Addressed This Visit      Unprioritized   Open-angle glaucoma    Managed with Ophthalmology with alphagan and cosopt      Hyperlipidemia with target LDL less than 160    She has no interest in starting medications given her age.   Lab Results  Component Value Date   CHOL 232 (H) 07/09/2020   HDL 56.80 07/09/2020   LDLCALC 147 (H) 07/09/2020   LDLDIRECT 160.4 05/28/2013   TRIG 142.0 07/09/2020   CHOLHDL 4 07/09/2020         Relevant Orders   Comprehensive metabolic panel (Completed)   Lipid panel (Completed)   TSH (Completed)   Encounter for preventive health examination - Primary    age appropriate education and counseling updated, referrals for preventative services and immunizations addressed, dietary and smoking counseling addressed, most recent labs reviewed.  I have personally reviewed and have noted:  1) the patient's medical and social history 2) The pt's use of alcohol, tobacco, and illicit drugs 3) The patient's current medications and supplements 4) Functional ability including ADL's, fall risk, home safety risk, hearing and visual impairment 5) Diet and physical activities 6) Evidence for depression or mood disorder 7) The patient's height, weight, and BMI have been recorded in the chart  I have  made referrals, and provided counseling and education based on review of the above      B12 deficiency    Switching to sublingual trial  per patient preference .  Will recheck level after 3 months       Relevant Orders   Vitamin B12 (Completed)   CBC with Differential/Platelet (Completed)   Active advance directive on file    Reviewed patient's  Do Not Resuscitate Orders  And advanced directives .  Patient continues to desire DNR status and has  the order prominently displaced in the home.          I have discontinued Maricela Bo. Mccuistion's cyanocobalamin. I am also having her start on Cyanocobalamin and Tdap. Additionally, I am having her maintain her dorzolamide-timolol, Lumigan, Alphagan P, and Eye Multivitamin/Lutein.  Meds ordered this encounter  Medications  . Cyanocobalamin 1000 MCG SUBL    Sig: Place 1 tablet (1,000 mcg total) under the tongue daily.    Dispense:  90 tablet    Refill:  1  . Tdap (BOOSTRIX) 5-2.5-18.5 LF-MCG/0.5 injection    Sig: Inject 0.5 mLs into the muscle once for 1 dose.    Dispense:  0.5 mL    Refill:  0    Medications Discontinued During This Encounter  Medication Reason  . cyanocobalamin (,VITAMIN B-12,) 1000 MCG/ML injection     Follow-up: Return in about 3 months (around 10/07/2020).   Crecencio Mc, MD

## 2020-07-11 NOTE — Assessment & Plan Note (Signed)
She has no interest in starting medications given her age.   Lab Results  Component Value Date   CHOL 232 (H) 07/09/2020   HDL 56.80 07/09/2020   LDLCALC 147 (H) 07/09/2020   LDLDIRECT 160.4 05/28/2013   TRIG 142.0 07/09/2020   CHOLHDL 4 07/09/2020

## 2020-07-11 NOTE — Assessment & Plan Note (Signed)
Managed with Ophthalmology with alphagan and cosopt

## 2020-07-11 NOTE — Assessment & Plan Note (Signed)
Reviewed patient's  Do Not Resuscitate Orders  And advanced directives .  Patient continues to desire DNR status and has the order prominently displaced in the home.

## 2020-07-13 ENCOUNTER — Other Ambulatory Visit (INDEPENDENT_AMBULATORY_CARE_PROVIDER_SITE_OTHER): Payer: Medicare PPO

## 2020-07-13 ENCOUNTER — Other Ambulatory Visit: Payer: Self-pay | Admitting: Internal Medicine

## 2020-07-13 ENCOUNTER — Other Ambulatory Visit: Payer: Self-pay

## 2020-07-13 DIAGNOSIS — D582 Other hemoglobinopathies: Secondary | ICD-10-CM

## 2020-07-13 DIAGNOSIS — R17 Unspecified jaundice: Secondary | ICD-10-CM

## 2020-07-13 DIAGNOSIS — E875 Hyperkalemia: Secondary | ICD-10-CM | POA: Diagnosis not present

## 2020-07-13 LAB — BASIC METABOLIC PANEL
BUN: 13 mg/dL (ref 6–23)
CO2: 27 mEq/L (ref 19–32)
Calcium: 9.7 mg/dL (ref 8.4–10.5)
Chloride: 109 mEq/L (ref 96–112)
Creatinine, Ser: 1.16 mg/dL (ref 0.40–1.20)
GFR: 44.57 mL/min — ABNORMAL LOW (ref >60.00)
Glucose, Bld: 114 mg/dL — ABNORMAL HIGH (ref 70–99)
Potassium: 4.7 mEq/L (ref 3.5–5.1)
Sodium: 143 mEq/L (ref 135–145)

## 2020-07-13 LAB — CBC WITH DIFFERENTIAL/PLATELET
Basophils Absolute: 0 10*3/uL (ref 0.0–0.1)
Basophils Relative: 0.5 % (ref 0.0–3.0)
Eosinophils Absolute: 0.1 10*3/uL (ref 0.0–0.7)
Eosinophils Relative: 2.4 % (ref 0.0–5.0)
HCT: 45.6 % (ref 36.0–46.0)
Hemoglobin: 15.3 g/dL — ABNORMAL HIGH (ref 12.0–15.0)
Lymphocytes Relative: 25.1 % (ref 12.0–46.0)
Lymphs Abs: 1.5 10*3/uL (ref 0.7–4.0)
MCHC: 33.5 g/dL (ref 30.0–36.0)
MCV: 91.9 fl (ref 78.0–100.0)
Monocytes Absolute: 0.7 10*3/uL (ref 0.1–1.0)
Monocytes Relative: 10.8 % (ref 3.0–12.0)
Neutro Abs: 3.7 10*3/uL (ref 1.4–7.7)
Neutrophils Relative %: 61.2 % (ref 43.0–77.0)
Platelets: 191 10*3/uL (ref 150.0–400.0)
RBC: 4.96 Mil/uL (ref 3.87–5.11)
RDW: 13.7 % (ref 11.5–15.5)
WBC: 6.1 10*3/uL (ref 4.0–10.5)

## 2020-07-13 LAB — HEPATIC FUNCTION PANEL
ALT: 11 U/L (ref 0–35)
AST: 15 U/L (ref 0–37)
Albumin: 4.4 g/dL (ref 3.5–5.2)
Alkaline Phosphatase: 70 U/L (ref 39–117)
Bilirubin, Direct: 0.3 mg/dL (ref 0.0–0.3)
Total Bilirubin: 2.1 mg/dL — ABNORMAL HIGH (ref 0.2–1.2)
Total Protein: 6.4 g/dL (ref 6.0–8.3)

## 2020-07-13 NOTE — Progress Notes (Signed)
A few of your labs were abnormal.  It may be a processing issue,  but they should be rechecked.  (potassium, bilirubin,  and hemoglobin were all elevated)   Can you return for a non fasting lab appt ?

## 2020-07-14 ENCOUNTER — Other Ambulatory Visit: Payer: Self-pay | Admitting: Internal Medicine

## 2020-07-14 DIAGNOSIS — R944 Abnormal results of kidney function studies: Secondary | ICD-10-CM

## 2020-07-14 LAB — IRON,TIBC AND FERRITIN PANEL
%SAT: 35 % (ref 16–45)
Ferritin: 38 ng/mL (ref 16–288)
Iron: 125 ug/dL (ref 45–160)
TIBC: 353 ug/dL (ref 250–450)

## 2020-07-23 ENCOUNTER — Other Ambulatory Visit: Payer: Self-pay | Admitting: Internal Medicine

## 2020-07-28 ENCOUNTER — Ambulatory Visit: Payer: Medicare PPO

## 2020-08-26 ENCOUNTER — Other Ambulatory Visit: Payer: Self-pay

## 2020-08-26 ENCOUNTER — Ambulatory Visit: Payer: Medicare PPO | Admitting: Dermatology

## 2020-08-26 DIAGNOSIS — L578 Other skin changes due to chronic exposure to nonionizing radiation: Secondary | ICD-10-CM | POA: Diagnosis not present

## 2020-08-26 DIAGNOSIS — L820 Inflamed seborrheic keratosis: Secondary | ICD-10-CM | POA: Diagnosis not present

## 2020-08-26 DIAGNOSIS — L570 Actinic keratosis: Secondary | ICD-10-CM

## 2020-08-26 DIAGNOSIS — L720 Epidermal cyst: Secondary | ICD-10-CM | POA: Diagnosis not present

## 2020-08-26 DIAGNOSIS — L821 Other seborrheic keratosis: Secondary | ICD-10-CM

## 2020-08-26 NOTE — Progress Notes (Signed)
   New Patient Visit  Subjective  Maria Christensen is a 80 y.o. female who presents for the following: skin lesions (On the L temple/forehead, L cheek, and L neck - irregular appearing, patient is concerned about them and would like them checked).  The following portions of the chart were reviewed this encounter and updated as appropriate:   Tobacco  Allergies  Meds  Problems  Med Hx  Surg Hx  Fam Hx     Review of Systems:  No other skin or systemic complaints except as noted in HPI or Assessment and Plan.  Objective  Well appearing patient in no apparent distress; mood and affect are within normal limits.  A focused examination was performed including the face and neck . Relevant physical exam findings are noted in the Assessment and Plan.  Objective  L temple x 1, L cheek x 1, L neck x 1 (3): Erythematous keratotic or waxy stuck-on papule or plaque.   Objective  L medial infraorbital: White papules.  Objective  L mid brow x 1: Erythematous thin papules/macules with gritty scale.   Assessment & Plan  Inflamed seborrheic keratosis (3) L temple x 1, L cheek x 1, L neck x 1  Destruction of lesion - L temple x 1, L cheek x 1, L neck x 1 Complexity: simple   Destruction method: cryotherapy   Informed consent: discussed and consent obtained   Timeout:  patient name, date of birth, surgical site, and procedure verified Lesion destroyed using liquid nitrogen: Yes   Region frozen until ice ball extended beyond lesion: Yes   Outcome: patient tolerated procedure well with no complications   Post-procedure details: wound care instructions given    Epidermal inclusion cyst L medial infraorbital  Benign appearing, observe.  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 - Observe - Call for any changes  Actinic Damage - chronic, secondary to cumulative UV radiation exposure/sun exposure over time - diffuse scaly erythematous macules with underlying dyspigmentation - Recommend daily broad spectrum sunscreen SPF 30+ to sun-exposed areas, reapply every 2 hours as needed.  - Call for new or changing lesions.  Return in about 1 year (around 08/26/2021) for recheck of sun exposed areas - hx AK's and ISK's .  Luther Redo, CMA, am acting as scribe for Sarina Ser, MD .  Documentation: I have reviewed the above documentation for accuracy and completeness, and I agree with the above.  Sarina Ser, MD

## 2020-08-26 NOTE — Patient Instructions (Signed)
Seborrheic Keratosis  What causes seborrheic keratoses? Seborrheic keratoses are harmless, common skin growths that first appear during adult life.  As time goes by, more growths appear.  Some people may develop a large number of them.  Seborrheic keratoses appear on both covered and uncovered body parts.  They are not caused by sunlight.  The tendency to develop seborrheic keratoses can be inherited.  They vary in color from skin-colored to gray, brown, or even black.  They can be either smooth or have a rough, warty surface.   Seborrheic keratoses are superficial and look as if they were stuck on the skin.  Under the microscope this type of keratosis looks like layers upon layers of skin.  That is why at times the top layer may seem to fall off, but the rest of the growth remains and re-grows.    Treatment Seborrheic keratoses do not need to be treated, but can easily be removed in the office.  Seborrheic keratoses often cause symptoms when they rub on clothing or jewelry.  Lesions can be in the way of shaving.  If they become inflamed, they can cause itching, soreness, or burning.  Removal of a seborrheic keratosis can be accomplished by freezing, burning, or surgery. If any spot bleeds, scabs, or grows rapidly, please return to have it checked, as these can be an indication of a skin cancer.  Actinic Keratosis An actinic keratosis is a precancerous growth on the skin. If there is more than one growth, the condition is called actinic keratoses. Actinic keratoses appear most often on areas of skin that get a lot of sun exposure, including the scalp, face, ears, lips, upper back, forearms, and the backs of the hands. If left untreated, these growths may develop into a skin cancer called squamous cell carcinoma. It is important to have all these growths checked by a health care provider to determine the best treatment approach. What are the causes? Actinic keratoses are caused by getting too much  ultraviolet (UV) radiation from the sun or other UV light sources. What increases the risk? You are more likely to develop this condition if you:  Have light-colored skin and blue eyes.  Have blond or red hair.  Spend a lot of time in the sun.  Do not protect your skin from the sun when outdoors.  Are an older person. The risk of developing an actinic keratosis increases with age. What are the signs or symptoms? Actinic keratoses feel like scaly, rough spots of skin. Symptoms of this condition include growths that may:  Be as small as a pinhead or as big as a quarter.  Itch, hurt, or feel sensitive.  Be skin-colored, light tan, dark tan, pink, or a combination of any of these colors. In most cases, the growths become red.  Have a small piece of pink or gray skin (skin tag) growing from them. It may be easier to notice actinic keratoses by feeling them, rather than seeing them. Sometimes, actinic keratoses disappear, but many reappear a few days to a few weeks later.   How is this diagnosed? This condition is usually diagnosed with a physical exam.  A tissue sample may be removed from the actinic keratosis and examined under a microscope (biopsy). How is this treated? If needed, this condition may be treated by:  Scraping off the actinic keratosis (curettage).  Freezing the actinic keratosis with liquid nitrogen (cryosurgery). This causes the growth to eventually fall off the skin.  Applying medicated creams or  gels to destroy the cells in the growth.  Applying chemicals to the actinic keratosis to make the outer layers of skin peel off (chemical peel).  Using photodynamic therapy. In this procedure, medicated cream is applied to the actinic keratosis. This cream increases your skin's sensitivity to light. Then, a strong light is aimed at the actinic keratosis to destroy cells in the growth. Follow these instructions at home: Skin care  Apply cool, wet cloths (cool  compresses) to the affected areas.  Do not scratch your skin.  Check your skin regularly for any growths, especially growths that: ? Start to itch or bleed. ? Change in size, shape, or color. Caring for the treated area  Keep the treated area clean and dry as told by your health care provider.  Do not apply any medicine, cream, or lotion to the treated area unless your health care provider tells you to do that.  Do not pick at blisters or try to break them open. This can cause infection and scarring.  If you have red or irritated skin after treatment, follow instructions from your health care provider about how to take care of the treated area. Make sure you: ? Wash your hands with soap and water before you change your bandage (dressing). If soap and water are not available, use hand sanitizer. ? Change your dressing as told by your health care provider.  If you have red or irritated skin after treatment, check your treated area every day for signs of infection. Check for: ? Redness, swelling, or pain. ? Fluid or blood. ? Warmth. ? Pus or a bad smell. General instructions  Take or apply over-the-counter and prescription medicines only as told by your health care provider.  Return to your normal activities as told by your health care provider. Ask your health care provider what activities are safe for you.  Have a skin exam done every year by a health care provider who is a skin specialist (dermatologist).  Keep all follow-up visits as told by your health care provider. This is important. Lifestyle  Do not use any products that contain nicotine or tobacco, such as cigarettes and e-cigarettes. If you need help quitting, ask your health care provider.  Take steps to protect your skin from the sun. ? Try to avoid the sun between 10:00 a.m. and 4:00 p.m. This is when the UV light is the strongest. ? Use a sunscreen or sunblock with SPF 30 (sun protection factor 30) or  greater. ? Apply sunscreen before you are exposed to sunlight and reapply as often as directed by the instructions on the sunscreen container. ? Always wear sunglasses that have UV protection, and always wear a hat and clothing to protect your skin from sunlight. ? When possible, avoid medicines that increase your sensitivity to sunlight. ? Do not use tanning beds or other indoor tanning devices. Contact a health care provider if:  You notice any changes or new growths on your skin.  You have swelling, pain, or more redness around your treated area.  You have fluid or blood coming from your treated area.  Your treated area feels warm to the touch.  You have pus or a bad smell coming from your treated area.  You have a fever.  You have a blister that becomes large and painful. Summary  An actinic keratosis is a precancerous growth on the skin. If there is more than one growth, the condition is called actinic keratoses. In some cases, if left  untreated, these growths can develop into skin cancer.  Check your skin regularly for any growths, especially growths that start to itch or bleed, or change in size, shape, or color.  Take steps to protect your skin from the sun.  Contact a health care provider if you notice any changes or new growths on your skin.  Keep all follow-up visits as told by your health care provider. This is important. This information is not intended to replace advice given to you by your health care provider. Make sure you discuss any questions you have with your health care provider. Document Revised: 12/05/2017 Document Reviewed: 12/05/2017 Elsevier Patient Education  Maria Christensen.

## 2020-08-31 ENCOUNTER — Encounter: Payer: Self-pay | Admitting: Dermatology

## 2020-10-09 ENCOUNTER — Encounter: Payer: Self-pay | Admitting: Internal Medicine

## 2020-10-09 ENCOUNTER — Ambulatory Visit: Payer: Medicare PPO | Admitting: Internal Medicine

## 2020-10-09 ENCOUNTER — Other Ambulatory Visit: Payer: Self-pay

## 2020-10-09 VITALS — BP 118/76 | HR 75 | Temp 97.8°F | Ht 64.0 in | Wt 136.2 lb

## 2020-10-09 DIAGNOSIS — E538 Deficiency of other specified B group vitamins: Secondary | ICD-10-CM | POA: Diagnosis not present

## 2020-10-09 DIAGNOSIS — R944 Abnormal results of kidney function studies: Secondary | ICD-10-CM | POA: Diagnosis not present

## 2020-10-09 DIAGNOSIS — N1831 Chronic kidney disease, stage 3a: Secondary | ICD-10-CM | POA: Diagnosis not present

## 2020-10-09 LAB — RENAL FUNCTION PANEL
Albumin: 4.4 g/dL (ref 3.5–5.2)
BUN: 19 mg/dL (ref 6–23)
CO2: 21 mEq/L (ref 19–32)
Calcium: 9.9 mg/dL (ref 8.4–10.5)
Chloride: 107 mEq/L (ref 96–112)
Creatinine, Ser: 1 mg/dL (ref 0.40–1.20)
GFR: 53.17 mL/min — ABNORMAL LOW (ref >60.00)
Glucose, Bld: 94 mg/dL (ref 70–99)
Phosphorus: 3.6 mg/dL (ref 2.3–4.6)
Potassium: 5.3 mEq/L — ABNORMAL HIGH (ref 3.5–5.1)
Sodium: 140 mEq/L (ref 135–145)

## 2020-10-09 LAB — MICROALBUMIN / CREATININE URINE RATIO
Creatinine,U: 161.2 mg/dL
Microalb Creat Ratio: 0.8 mg/g (ref 0.0–30.0)
Microalb, Ur: 1.3 mg/dL (ref 0.0–1.9)

## 2020-10-09 LAB — VITAMIN B12: Vitamin B-12: >1506 pg/mL — ABNORMAL HIGH (ref 211–911)

## 2020-10-09 NOTE — Progress Notes (Signed)
Subjective:  Patient ID: Maria Christensen, female    DOB: 1940-11-20  Age: 80 y.o. MRN: [id number]  CC: The primary encounter diagnosis was Decreased GFR. Diagnoses of B12 deficiency and Stage 3a chronic kidney disease (Maria Christensen) were also pertinent to this visit.   HPI [name] presents for FOLLOW UP ON B12 DEFICIENCY,  RENAL INSUFFICIENCY  This visit occurred during the SARS-CoV-2 public health emergency.  Safety protocols were in place, including screening questions prior to the visit, additional usage of staff PPE, and extensive cleaning of exam room while observing appropriate contact time as indicated for disinfecting solutions.   DECREASED GFR:  DOESN'T DRINK WATER , DRINKS COFFEE AND DR PEPPER ONLY,  B12 MAKING A BIG DIFFERENCE IN MENTATION AND ENERGY LEVEL   HAS BEEN TAKING SUBLINGUAL 1000 MCG SINCE December   IF ANTIBODY POSIT VE  CC :      Outpatient Medications Prior to Visit  Medication Sig Dispense Refill  . Cyanocobalamin 1000 MCG SUBL Place 1 tablet (1,000 mcg total) under the tongue daily. 90 tablet 1  . dorzolamide-timolol (COSOPT) 22.3-6.8 MG/ML ophthalmic solution Place 1 drop into both eyes 2 (two) times daily.     Marland Kitchen LUMIGAN 0.01 % SOLN Place 1 drop into both eyes at bedtime.     . Multiple Vitamins-Minerals (EYE MULTIVITAMIN/LUTEIN) TABS Take 1 tablet by mouth daily.    . ALPHAGAN P 0.1 % SOLN PLACE 1 DROP INTO BOTH EYES TWICE DAILY 5 mL 0   No facility-administered medications prior to visit.    Review of Systems;  Patient denies headache, fevers, malaise, unintentional weight loss, skin rash, eye pain, sinus congestion and sinus pain, sore throat, dysphagia,  hemoptysis , cough, dyspnea, wheezing, chest pain, palpitations, orthopnea, edema, abdominal pain, nausea, melena, diarrhea, constipation, flank pain, dysuria, hematuria, urinary  Frequency, nocturia, numbness, tingling, seizures,  Focal weakness, Loss of consciousness,  Tremor, insomnia, depression,  anxiety, and suicidal ideation.      Objective:  BP 118/76   Pulse 75   Temp 97.8 F (36.6 C)   Ht 5\' 4"  (1.626 m)   Wt 136 lb 3 oz (61.8 kg)   SpO2 99%   BMI 23.38 kg/m   BP Readings from Last 3 Encounters:  10/09/20 118/76  07/09/20 138/82  07/02/19 128/82    Wt Readings from Last 3 Encounters:  10/09/20 136 lb 3 oz (61.8 kg)  07/09/20 139 lb 12.8 oz (63.4 kg)  07/02/19 134 lb 12.8 oz (61.1 kg)    General appearance: alert, cooperative and appears stated age Ears: normal TM's and external ear canals both ears Throat: lips, mucosa, and tongue normal; teeth and gums normal Neck: no adenopathy, no carotid bruit, supple, symmetrical, trachea midline and thyroid not enlarged, symmetric, no tenderness/mass/nodules Back: symmetric, no curvature. ROM normal. No CVA tenderness. Lungs: clear to auscultation bilaterally Heart: regular rate and rhythm, S1, S2 normal, no murmur, click, rub or gallop Abdomen: soft, non-tender; bowel sounds normal; no masses,  no organomegaly Pulses: 2+ and symmetric Skin: Skin color, texture, turgor normal. No rashes or lesions Lymph nodes: Cervical, supraclavicular, and axillary nodes normal.  Lab Results  Component Value Date   HGBA1C 5.6 07/02/2019    Lab Results  Component Value Date   CREATININE 1.00 10/09/2020   CREATININE 1.16 07/13/2020   CREATININE 1.07 07/09/2020    Lab Results  Component Value Date   WBC 6.1 07/13/2020   HGB 15.3 (H) 07/13/2020   HCT 45.6 07/13/2020  PLT 191.0 07/13/2020   GLUCOSE 94 10/09/2020   CHOL 232 (H) 07/09/2020   TRIG 142.0 07/09/2020   HDL 56.80 07/09/2020   LDLDIRECT 160.4 05/28/2013   LDLCALC 147 (H) 07/09/2020   ALT 11 07/13/2020   AST 15 07/13/2020   NA 140 10/09/2020   K 5.3 (H) 10/09/2020   CL 107 10/09/2020   CREATININE 1.00 10/09/2020   BUN 19 10/09/2020   CO2 21 10/09/2020   TSH 1.38 07/09/2020   HGBA1C 5.6 07/02/2019   MICROALBUR 1.3 10/09/2020    No results  found.  Assessment & Plan:   Problem List Items Addressed This Visit      Unprioritized   B12 deficiency    Switching to sublingual trial  per patient preference .  Will recheck level after 3 months       Relevant Orders   Vitamin B12 (Completed)   CKD (chronic kidney disease) stage 3, GFR 30-59 ml/min (HCC)    Improved compared to December . With recurrent hyperkalemia that  Is likely a collection issue since since it has recurred multiple times and  repeat level each time has been normal       Other Visit Diagnoses    Decreased GFR    -  Primary   Relevant Orders   Renal function panel (Completed)   Microalbumin / creatinine urine ratio (Completed)      I have discontinued Maricela Bo. Renne's Alphagan P. I am also having her maintain her dorzolamide-timolol, Lumigan, Eye Multivitamin/Lutein, and Cyanocobalamin.  No orders of the defined types were placed in this encounter.   Medications Discontinued During This Encounter  Medication Reason  . ALPHAGAN P 0.1 % SOLN     Follow-up: No follow-ups on file.   Crecencio Mc, MD

## 2020-10-09 NOTE — Patient Instructions (Signed)
GOAL WATER CONSUMPTION (CAN INCLUDE ALL NON CAFFEINATED BEVERAGES ) IS 60 OUNCES DAILY TO MAINTAING GOOD HYDRATION  MORE IS NEEDED IF YOU WORK OUTSIDE IN THE SUMMER  IF YOU SWITCH TO ORAL B12 TABLETS,    INCREASE DOSE TO 2000 MCG DAILY AND WE WILL RECHECK B12 IN 3 MONTHS

## 2020-10-11 ENCOUNTER — Other Ambulatory Visit: Payer: Self-pay | Admitting: Internal Medicine

## 2020-10-11 DIAGNOSIS — N183 Chronic kidney disease, stage 3 unspecified: Secondary | ICD-10-CM | POA: Insufficient documentation

## 2020-10-11 DIAGNOSIS — E875 Hyperkalemia: Secondary | ICD-10-CM

## 2020-10-11 NOTE — Assessment & Plan Note (Addendum)
Improved compared to December . With recurrent hyperkalemia that  Is likely a collection issue since since it has recurred multiple times and  repeat level each time has been normal

## 2020-10-11 NOTE — Assessment & Plan Note (Signed)
Switching to sublingual trial  per patient preference .  Will recheck level after 3 months

## 2020-10-13 ENCOUNTER — Other Ambulatory Visit (INDEPENDENT_AMBULATORY_CARE_PROVIDER_SITE_OTHER): Payer: Medicare PPO

## 2020-10-13 ENCOUNTER — Other Ambulatory Visit: Payer: Self-pay

## 2020-10-13 DIAGNOSIS — E875 Hyperkalemia: Secondary | ICD-10-CM | POA: Diagnosis not present

## 2020-10-13 LAB — BASIC METABOLIC PANEL
BUN: 12 mg/dL (ref 6–23)
CO2: 24 mEq/L (ref 19–32)
Calcium: 9.2 mg/dL (ref 8.4–10.5)
Chloride: 109 mEq/L (ref 96–112)
Creatinine, Ser: 0.95 mg/dL (ref 0.40–1.20)
GFR: 56.54 mL/min — ABNORMAL LOW (ref >60.00)
Glucose, Bld: 122 mg/dL — ABNORMAL HIGH (ref 70–99)
Potassium: 4.7 mEq/L (ref 3.5–5.1)
Sodium: 140 mEq/L (ref 135–145)

## 2020-10-23 DIAGNOSIS — Z0100 Encounter for examination of eyes and vision without abnormal findings: Secondary | ICD-10-CM | POA: Diagnosis not present

## 2020-10-23 DIAGNOSIS — H2511 Age-related nuclear cataract, right eye: Secondary | ICD-10-CM | POA: Diagnosis not present

## 2020-10-23 DIAGNOSIS — H401133 Primary open-angle glaucoma, bilateral, severe stage: Secondary | ICD-10-CM | POA: Diagnosis not present

## 2020-11-26 ENCOUNTER — Ambulatory Visit: Payer: Medicare PPO

## 2020-11-26 DIAGNOSIS — H2511 Age-related nuclear cataract, right eye: Secondary | ICD-10-CM | POA: Diagnosis not present

## 2020-12-08 ENCOUNTER — Encounter: Payer: Self-pay | Admitting: Ophthalmology

## 2020-12-08 ENCOUNTER — Other Ambulatory Visit: Payer: Self-pay

## 2020-12-14 NOTE — Discharge Instructions (Signed)

## 2020-12-16 ENCOUNTER — Other Ambulatory Visit: Payer: Self-pay

## 2020-12-16 ENCOUNTER — Ambulatory Visit: Payer: Medicare PPO | Admitting: Anesthesiology

## 2020-12-16 ENCOUNTER — Encounter
Admission: RE | Disposition: A | Discharge status: Home / Self Care | Payer: Self-pay | Source: Home / Self Care | Attending: Ophthalmology

## 2020-12-16 ENCOUNTER — Ambulatory Visit
Admission: RE | Admit: 2020-12-16 | Discharge: 2020-12-16 | Disposition: A | Discharge status: Home / Self Care | Payer: Medicare PPO | Attending: Ophthalmology | Admitting: Ophthalmology

## 2020-12-16 ENCOUNTER — Encounter: Payer: Self-pay | Admitting: Ophthalmology

## 2020-12-16 DIAGNOSIS — Z800 Family history of malignant neoplasm of digestive organs: Secondary | ICD-10-CM | POA: Insufficient documentation

## 2020-12-16 DIAGNOSIS — H401113 Primary open-angle glaucoma, right eye, severe stage: Secondary | ICD-10-CM | POA: Insufficient documentation

## 2020-12-16 DIAGNOSIS — Z87891 Personal history of nicotine dependence: Secondary | ICD-10-CM | POA: Diagnosis not present

## 2020-12-16 DIAGNOSIS — H25811 Combined forms of age-related cataract, right eye: Secondary | ICD-10-CM | POA: Diagnosis not present

## 2020-12-16 DIAGNOSIS — Z801 Family history of malignant neoplasm of trachea, bronchus and lung: Secondary | ICD-10-CM | POA: Diagnosis not present

## 2020-12-16 DIAGNOSIS — Z8262 Family history of osteoporosis: Secondary | ICD-10-CM | POA: Diagnosis not present

## 2020-12-16 DIAGNOSIS — Z820 Family history of epilepsy and other diseases of the nervous system: Secondary | ICD-10-CM | POA: Diagnosis not present

## 2020-12-16 DIAGNOSIS — H2511 Age-related nuclear cataract, right eye: Secondary | ICD-10-CM | POA: Diagnosis not present

## 2020-12-16 DIAGNOSIS — Z833 Family history of diabetes mellitus: Secondary | ICD-10-CM | POA: Insufficient documentation

## 2020-12-16 DIAGNOSIS — Z8249 Family history of ischemic heart disease and other diseases of the circulatory system: Secondary | ICD-10-CM | POA: Diagnosis not present

## 2020-12-16 HISTORY — DX: Other specified postprocedural states: R11.2

## 2020-12-16 HISTORY — DX: Other specified postprocedural states: Z98.890

## 2020-12-16 HISTORY — DX: Presence of external hearing-aid: Z97.4

## 2020-12-16 HISTORY — PX: CATARACT EXTRACTION W/PHACO: SHX586

## 2020-12-16 SURGERY — PHACOEMULSIFICATION, CATARACT, WITH IOL INSERTION
Anesthesia: Monitor Anesthesia Care | Site: Eye | Laterality: Right

## 2020-12-16 MED ORDER — LACTATED RINGERS IV SOLN: INTRAVENOUS | Status: DC

## 2020-12-16 MED ORDER — ARMC OPHTHALMIC DILATING DROPS
1.0000 "application " | OPHTHALMIC | Status: DC | PRN
  Administered 2020-12-16 (×3): 1 via OPHTHALMIC

## 2020-12-16 MED ORDER — EPINEPHRINE PF 1 MG/ML IJ SOLN
INTRAOCULAR | Status: DC | PRN
  Administered 2020-12-16: 56 mL via OPHTHALMIC

## 2020-12-16 MED ORDER — LIDOCAINE HCL (PF) 2 % IJ SOLN
INTRAOCULAR | Status: DC | PRN
  Administered 2020-12-16: 2 mL

## 2020-12-16 MED ORDER — TETRACAINE HCL 0.5 % OP SOLN
1.0000 [drp] | OPHTHALMIC | Status: DC | PRN
  Administered 2020-12-16 (×3): 1 [drp] via OPHTHALMIC

## 2020-12-16 MED ORDER — CEFUROXIME OPHTHALMIC INJECTION 1 MG/0.1 ML
INJECTION | OPHTHALMIC | Status: DC | PRN
  Administered 2020-12-16: 0.1 mL via INTRACAMERAL

## 2020-12-16 MED ORDER — ONDANSETRON HCL 4 MG/2ML IJ SOLN
INTRAMUSCULAR | Status: DC | PRN
  Administered 2020-12-16: 4 mg via INTRAVENOUS

## 2020-12-16 MED ORDER — MIDAZOLAM HCL 2 MG/2ML IJ SOLN
INTRAMUSCULAR | Status: DC | PRN
  Administered 2020-12-16: 1.5 mg via INTRAVENOUS

## 2020-12-16 MED ORDER — NA HYALUR & NA CHOND-NA HYALUR 0.4-0.35 ML IO KIT
PACK | INTRAOCULAR | Status: DC | PRN
  Administered 2020-12-16: 1 mL via INTRAOCULAR

## 2020-12-16 MED ORDER — ONDANSETRON HCL 4 MG/2ML IJ SOLN
4.0000 mg | Freq: Once | INTRAMUSCULAR | Status: AC
  Administered 2020-12-16: 4 mg via INTRAVENOUS

## 2020-12-16 MED ORDER — FENTANYL CITRATE (PF) 100 MCG/2ML IJ SOLN
INTRAMUSCULAR | Status: DC | PRN
  Administered 2020-12-16: 50 ug via INTRAVENOUS

## 2020-12-16 SURGICAL SUPPLY — 23 items
BLADE DUAL KAHOOK SINGLE USE (BLADE) ×1 IMPLANT
CANNULA ANT/CHMB 27G (MISCELLANEOUS) ×1 IMPLANT
CANNULA ANT/CHMB 27GA (MISCELLANEOUS) ×2 IMPLANT
GLOVE SURG ENC TEXT LTX SZ7.5 (GLOVE) ×2 IMPLANT
GLOVE SURG TRIUMPH 8.0 PF LTX (GLOVE) ×2 IMPLANT
GOWN STRL REUS W/ TWL LRG LVL3 (GOWN DISPOSABLE) ×2 IMPLANT
GOWN STRL REUS W/TWL LRG LVL3 (GOWN DISPOSABLE) ×4
ICLIP (OPHTHALMIC RELATED) ×1 IMPLANT
LENS IOL TECNIS EYHANCE 21.0 (Intraocular Lens) ×1 IMPLANT
MARKER SKIN DUAL TIP RULER LAB (MISCELLANEOUS) ×2 IMPLANT
NDL CAPSULORHEX 25GA (NEEDLE) ×1 IMPLANT
NDL FILTER BLUNT 18X1 1/2 (NEEDLE) ×2 IMPLANT
NEEDLE CAPSULORHEX 25GA (NEEDLE) ×2 IMPLANT
NEEDLE FILTER BLUNT 18X 1/2SAF (NEEDLE) ×2
NEEDLE FILTER BLUNT 18X1 1/2 (NEEDLE) ×2 IMPLANT
PACK CATARACT BRASINGTON (MISCELLANEOUS) ×2 IMPLANT
PACK EYE AFTER SURG (MISCELLANEOUS) ×2 IMPLANT
PACK OPTHALMIC (MISCELLANEOUS) ×2 IMPLANT
SOLUTION OPHTHALMIC SALT (MISCELLANEOUS) ×2 IMPLANT
SYR 3ML LL SCALE MARK (SYRINGE) ×4 IMPLANT
SYR TB 1ML LUER SLIP (SYRINGE) ×2 IMPLANT
WATER STERILE IRR 250ML POUR (IV SOLUTION) ×2 IMPLANT
WIPE NON LINTING 3.25X3.25 (MISCELLANEOUS) ×2 IMPLANT

## 2020-12-16 NOTE — H&P (Signed)
[facility]   Primary Care Physician:  Crecencio Mc, MD Ophthalmologist: Dr. Leandrew Koyanagi  Pre-Procedure History & Physical: HPI:  Maria Christensen is a 80 y.o. female here for ophthalmic surgery.   Past Medical History:  Diagnosis Date  . 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 Cleft palate   . Glaucoma    managed b [place],  bilateral  . Herpes zoster   . Mitral valve prolapse    2005 no deficits  . Open-angle glaucoma    MANAGED BY DR Janine [facility]   . Osteopenia   . PONV (postoperative nausea and vomiting)   . Wears hearing aid in right ear     Past Surgical History:  Procedure Laterality Date  . ABDOMINAL HYSTERECTOMY    . BUNIONECTOMY  2002  . CATARACT EXTRACTION W/PHACO Left 04/01/2015   Procedure: CATARACT EXTRACTION PHACO AND INTRAOCULAR LENS PLACEMENT (IOC);  Surgeon: Leandrew Koyanagi, MD;  Location: [place];  Service: Ophthalmology;  Laterality: Left;  . CLEFT PALATE REPAIR      Prior to Admission medications   Medication Sig Start Date End Date Taking? Authorizing Provider  brimonidine (ALPHAGAN P) 0.1 % SOLN Place 1 drop into both eyes 2 (two) times daily.   Yes [provider]  Cyanocobalamin 1000 MCG SUBL Place 1 tablet (1,000 mcg total) under the tongue daily. 07/09/20  Yes Crecencio Mc, MD  dorzolamide-timolol (COSOPT) 22.3-6.8 MG/ML ophthalmic solution Place 1 drop into both eyes 2 (two) times daily.    Yes [provider]  LUMIGAN 0.01 % SOLN Place 1 drop into both eyes at bedtime.  10/24/14  Yes [provider]  Multiple Vitamins-Minerals (EYE MULTIVITAMIN/LUTEIN) TABS Take 1 tablet by mouth daily.   Yes [provider]    Allergies as of 10/27/2020  . (No Known Allergies)    Family History  Problem Relation Age of Onset  . Osteoporosis Mother   . Cancer Father        colon  . Heart attack Brother   . Diabetes Brother   . Glaucoma Brother 75       small cell lung  CA  . Hypertension Brother     Social History   Socioeconomic History  . Marital status: Widowed    Spouse name: Not on file  . Number of children: Not on file  . Years of education: Not on file  . Highest education level: Not on file  Occupational History  . Occupation: retired  Tobacco Use  . Smoking status: Former Smoker    Quit date: 11/15/1961    Years since quitting: 59.1  . Smokeless tobacco: Never Used  Vaping Use  . Vaping Use: Never used  Substance and Sexual Activity  . Alcohol use: No  . Drug use: No  . Sexual activity: Not on file  Other Topics Concern  . Not on file  Social History Narrative   Lives alone   Social Determinants of Health   Financial Resource Strain: Not on file  Food Insecurity: Not on file  Transportation Needs: Not on file  Physical Activity: Not on file  Stress: Not on file  Social Connections: Not on file  Intimate Partner Violence: Not on file    Review of Systems: See HPI, otherwise negative ROS  Physical Exam: BP (!) 180/80   Pulse 73   Temp 97.7 F (36.5 C) (Temporal)   Ht 5\' 4"  (1.626 m)   Wt 62.8 kg   SpO2 98%  BMI 23.77 kg/m  General:   Alert,  pleasant and cooperative in NAD Head:  Normocephalic and atraumatic. Lungs:  Clear to auscultation.    Heart:  Regular rate and rhythm.   Impression/Plan: Maria Christensen is here for ophthalmic surgery.  Risks, benefits, limitations, and alternatives regarding ophthalmic surgery have been reviewed with the patient.  Questions have been answered.  All parties agreeable.   Leandrew Koyanagi, MD  12/16/2020, 12:34 PM

## 2020-12-16 NOTE — Transfer of Care (Signed)
Immediate Anesthesia Transfer of Care Note  Patient: Maria Christensen  Procedure(s) Performed: CATARACT EXTRACTION PHACO AND INTRAOCULAR LENS PLACEMENT (IOC) RIGHT [place] DUAL BLADE GONIOTOMY IV ZOFRAN 5.11 00:56.2 9.1% (Right Eye)  Patient Location: PACU  Anesthesia Type: MAC  Level of Consciousness: awake, alert  and patient cooperative  Airway and Oxygen Therapy: Patient Spontanous Breathing and Patient connected to supplemental oxygen  Post-op Assessment: Post-op Vital signs reviewed, Patient's Cardiovascular Status Stable, Respiratory Function Stable, Patent Airway and No signs of Nausea or vomiting  Post-op Vital Signs: Reviewed and stable  Complications: No complications documented.

## 2020-12-16 NOTE — Anesthesia Procedure Notes (Signed)
Procedure Name: MAC Date/Time: 12/16/2020 1:35 PM Performed by: Mayme Genta, CRNA Pre-anesthesia Checklist: Patient identified, Emergency Drugs available, Suction available, Timeout performed and Patient being monitored Patient Re-evaluated:Patient Re-evaluated prior to induction Oxygen Delivery Method: Nasal cannula Placement Confirmation: positive ETCO2

## 2020-12-16 NOTE — Anesthesia Postprocedure Evaluation (Signed)
Anesthesia Post Note  Patient: Maria Christensen  Procedure(s) Performed: CATARACT EXTRACTION PHACO AND INTRAOCULAR LENS PLACEMENT (IOC) RIGHT [place] DUAL BLADE GONIOTOMY IV ZOFRAN 5.11 00:56.2 9.1% (Right Eye)     Patient location during evaluation: PACU Anesthesia Type: MAC Level of consciousness: awake and alert Pain management: pain level controlled Vital Signs Assessment: post-procedure vital signs reviewed and stable Respiratory status: spontaneous breathing, nonlabored ventilation, respiratory function stable and patient connected to nasal cannula oxygen Cardiovascular status: stable and blood pressure returned to baseline Postop Assessment: no apparent nausea or vomiting Anesthetic complications: no   No complications documented.  Fidel Levy

## 2020-12-16 NOTE — Anesthesia Preprocedure Evaluation (Signed)
Anesthesia Evaluation  Patient identified by MRN, date of birth, ID band Patient awake    Reviewed: NPO status   History of Anesthesia Complications (+) PONV and history of anesthetic complications  Airway Mallampati: III  TM Distance: >3 FB Neck ROM: full    Dental no notable dental hx.    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    Pulmonary exam normal        Cardiovascular Exercise Tolerance: Good negative cardio ROS Normal cardiovascular exam     Neuro/Psych Glaucoma;  Cerebral hemorrhage in 2005 ;  HOH; negative psych ROS   GI/Hepatic negative GI ROS, Neg liver ROS,   Endo/Other  negative endocrine ROS  Renal/GU Renal disease (ckd3)  negative genitourinary   Musculoskeletal   Abdominal   Peds  Hematology negative hematology ROS (+)   Anesthesia Other Findings pcp:  Crecencio Mc, MD at 10/11/2020  ;   Reproductive/Obstetrics                             Anesthesia Physical  Anesthesia Plan  ASA: II  Anesthesia Plan: MAC   Post-op Pain Management:    Induction:   PONV Risk Score and Plan: Midazolam, TIVA and Treatment may vary due to age or medical condition  Airway Management Planned:   Additional Equipment:   Intra-op Plan:   Post-operative Plan:   Informed Consent: I have reviewed the patients History and Physical, chart, labs and discussed the procedure including the risks, benefits and alternatives for the proposed anesthesia with the patient or authorized representative who has indicated his/her understanding and acceptance.       Plan Discussed with: CRNA  Anesthesia Plan Comments:         Anesthesia Quick Evaluation

## 2020-12-16 NOTE — Op Note (Signed)
  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 Item Serial No. Manufacturer Lot No. LRB No. Used Action  LENS IOL TECNIS EYHANCE 21.0 - [id number] Intraocular Lens LENS IOL TECNIS EYHANCE 21.0 [id number]   Right 1 Implanted    SURGEON:  Wyonia Hough, MD   ANESTHESIA:  Topical with tetracaine drops augmented with 1% preservative-free intracameral lidocaine.    COMPLICATIONS:  None.   DESCRIPTION OF PROCEDURE:  The patient was identified in the holding room and transported to the operating room and placed in the supine position under the operating microscope.  The right eye was identified as the operative eye and it was prepped and draped in the usual sterile ophthalmic fashion.   A 1 millimeter clear-corneal paracentesis was made at the 12:00 position.  0.5 ml of preservative-free 1% lidocaine was injected into the anterior chamber.  The anterior chamber was filled with Viscoat viscoelastic.  A 2.4 millimeter keratome was used to make a near-clear corneal incision at the 9:00 position. The microscope was adjusted and a gonioprism was used to visulaize the trabecular meshwork.  [facility] Dual Blade was advanced across the anterior chamber under viscoelastic.  The blade was used to mark the trabecular meshwork at the 1:30 position.  The blade was placed two clock hours clockwise into the meshwork.  Proper postioning was confirmed.  The  blade ws passed counterclockwise through the meshwork to excise approximately two to three clock-hours of trabecular meshwork.   A curvilinear capsulorrhexis was made with a cystotome and capsulorrhexis forceps.  Balanced salt solution was used to hydrodissect and hydrodelineate the nucleus.   Phacoemulsification was then used in stop and chop fashion to remove the lens nucleus and epinucleus.  The remaining cortex was then removed using the irrigation and aspiration handpiece. Provisc was then placed into the capsular bag to distend it for lens placement.  A lens was then injected into the capsular bag.  The remaining viscoelastic was aspirated.   Wounds were hydrated with balanced salt solution.  The anterior chamber was inflated to a physiologic pressure with balanced salt solution.  No wound leaks were noted. Cefuroxime 0.1 ml of a 10mg /ml solution was injected into the anterior chamber for a dose of 1 mg of intracameral antibiotic at the completion of the case.  The patient was taken to the recovery room in stable condition without complications of anesthesia or surgery.

## 2020-12-17 ENCOUNTER — Encounter: Payer: Self-pay | Admitting: Ophthalmology

## 2021-01-06 DIAGNOSIS — H90A31 Mixed conductive and sensorineural hearing loss, unilateral, right ear with restricted hearing on the contralateral side: Secondary | ICD-10-CM | POA: Diagnosis not present

## 2021-01-06 DIAGNOSIS — H6123 Impacted cerumen, bilateral: Secondary | ICD-10-CM | POA: Diagnosis not present

## 2021-01-28 ENCOUNTER — Encounter: Payer: Self-pay | Admitting: Internal Medicine

## 2021-01-28 ENCOUNTER — Ambulatory Visit: Payer: Medicare PPO | Admitting: Internal Medicine

## 2021-01-28 ENCOUNTER — Other Ambulatory Visit: Payer: Self-pay

## 2021-01-28 VITALS — BP 132/62 | HR 75 | Temp 98.5°F | Ht 64.0 in | Wt 138.6 lb

## 2021-01-28 DIAGNOSIS — N1831 Chronic kidney disease, stage 3a: Secondary | ICD-10-CM | POA: Diagnosis not present

## 2021-01-28 DIAGNOSIS — Z7189 Other specified counseling: Secondary | ICD-10-CM | POA: Diagnosis not present

## 2021-01-28 DIAGNOSIS — E785 Hyperlipidemia, unspecified: Secondary | ICD-10-CM | POA: Diagnosis not present

## 2021-01-28 MED ORDER — TETANUS-DIPHTH-ACELL PERTUSSIS 5-2.5-18.5 LF-MCG/0.5 IM SUSY: 0.5000 mL | PREFILLED_SYRINGE | Freq: Once | INTRAMUSCULAR | 0 refills | Status: AC

## 2021-01-28 NOTE — Patient Instructions (Addendum)
I recommend renewing your TDAP vaccine this summer since your last one was 2010 and it should be done every 10 years    Ask the VOB Nurse if B12 injections can be given monthly there

## 2021-01-28 NOTE — Progress Notes (Signed)
Subjective:  Patient ID: Maria Christensen, female    DOB: 1941/07/21  Age: 80 y.o. MRN: [id number]  CC: The primary encounter diagnosis was Do not resuscitate discussion. Diagnoses of Stage 3a chronic kidney disease ([place]) and Hyperlipidemia with target LDL less than 160 were also pertinent to this visit.  HPI Maria Christensen presents for  FOLLOW UP. On CKD stage 3,  hyperlipidemia and upcoming transition     This visit occurred during the SARS-CoV-2 public health emergency.  Safety protocols were in place, including screening questions prior to the visit, additional usage of staff PPE, and extensive cleaning of exam room while observing appropriate contact time as indicated for disinfecting solutions.    She is transitioning from her private [address] SF home to [place]. And requires several forms to be completed.  She reports some anxiety over the decision , mostly due to the cost.  But has been assured by the VOB agent.  that her finances are fine.  She   SOLD HER HOUSE YESTERDAY.  ONE REMAINING BROTHER IN [place].  She is tolerating her medications and avoiding use of NSAIDS.  No recent falls. Denies any signs of symptoms of cognitive decline.  Socially active, hs many friends at [place] .       Outpatient Medications Prior to Visit  Medication Sig Dispense Refill   brimonidine (ALPHAGAN P) 0.1 % SOLN Place 1 drop into both eyes 2 (two) times daily.     Cyanocobalamin 1000 MCG SUBL Place 1 tablet (1,000 mcg total) under the tongue daily. 90 tablet 1   dorzolamide-timolol (COSOPT) 22.3-6.8 MG/ML ophthalmic solution Place 1 drop into both eyes 2 (two) times daily.      LUMIGAN 0.01 % SOLN Place 1 drop into both eyes at bedtime.      Multiple Vitamins-Minerals (EYE MULTIVITAMIN/LUTEIN) TABS Take 1 tablet by mouth daily.     No facility-administered medications prior to visit.    Review of Systems;  Patient denies headache, fevers, malaise, unintentional weight loss, skin rash,  eye pain, sinus congestion and sinus pain, sore throat, dysphagia,  hemoptysis , cough, dyspnea, wheezing, chest pain, palpitations, orthopnea, edema, abdominal pain, nausea, melena, diarrhea, constipation, flank pain, dysuria, hematuria, urinary  Frequency, nocturia, numbness, tingling, seizures,  Focal weakness, Loss of consciousness,  Tremor, insomnia, depression, anxiety, and suicidal ideation.      Objective:  BP 132/62 (BP Location: Left Arm, Patient Position: Sitting, Cuff Size: Normal)   Pulse 75   Temp 98.5 F (36.9 C) (Oral)   Ht 5\' 4"  (1.626 m)   Wt 138 lb 9.6 oz (62.9 kg)   SpO2 99%   BMI 23.79 kg/m   BP Readings from Last 3 Encounters:  01/28/21 132/62  12/16/20 128/67  10/09/20 118/76    Wt Readings from Last 3 Encounters:  01/28/21 138 lb 9.6 oz (62.9 kg)  12/16/20 138 lb 8 oz (62.8 kg)  10/09/20 136 lb 3 oz (61.8 kg)    General appearance: alert, cooperative and appears stated age Ears: normal TM's and external ear canals both ears Throat: lips, mucosa, and tongue normal; teeth and gums normal Neck: no adenopathy, no carotid bruit, supple, symmetrical, trachea midline and thyroid not enlarged, symmetric, no tenderness/mass/nodules Back: symmetric, no curvature. ROM normal. No CVA tenderness. Lungs: clear to auscultation bilaterally Heart: regular rate and rhythm, S1, S2 normal, no murmur, click, rub or gallop Abdomen: soft, non-tender; bowel sounds normal; no masses,  no organomegaly Pulses: 2+ and symmetric  Skin: Skin color, texture, turgor normal. No rashes or lesions Lymph nodes: Cervical, supraclavicular, and axillary nodes normal.  Lab Results  Component Value Date   HGBA1C 5.6 07/02/2019    Lab Results  Component Value Date   CREATININE 0.95 10/13/2020   CREATININE 1.00 10/09/2020   CREATININE 1.16 07/13/2020    Lab Results  Component Value Date   WBC 6.1 07/13/2020   HGB 15.3 (H) 07/13/2020   HCT 45.6 07/13/2020   PLT 191.0 07/13/2020    GLUCOSE 122 (H) 10/13/2020   CHOL 232 (H) 07/09/2020   TRIG 142.0 07/09/2020   HDL 56.80 07/09/2020   LDLDIRECT 160.4 05/28/2013   LDLCALC 147 (H) 07/09/2020   ALT 11 07/13/2020   AST 15 07/13/2020   NA 140 10/13/2020   K 4.7 10/13/2020   CL 109 10/13/2020   CREATININE 0.95 10/13/2020   BUN 12 10/13/2020   CO2 24 10/13/2020   TSH 1.38 07/09/2020   HGBA1C 5.6 07/02/2019   MICROALBUR 1.3 10/09/2020    No results found.  Assessment & Plan:   Problem List Items Addressed This Visit       Unprioritized   CKD (chronic kidney disease) stage 3, GFR 30-59 ml/min (HCC)    Improved compared to December . With recurrent hyperkalemia that  Is likely a collection issue since since it has recurred multiple times and  repeat level each time has been normal.  Reminded to avoid use of NSAIDS,  historically  has deferred nephrology referral       Do not resuscitate discussion - Primary   Relevant Orders   DNR (Do Not Resuscitate)   Hyperlipidemia with target LDL less than 160    She has been offered staitn therapy in the past and repeats that she has no interest in starting medications given her age.   Lab Results  Component Value Date   CHOL 232 (H) 07/09/2020   HDL 56.80 07/09/2020   LDLCALC 147 (H) 07/09/2020   LDLDIRECT 160.4 05/28/2013   TRIG 142.0 07/09/2020   CHOLHDL 4 07/09/2020           I am having Maria Christensen start on Tdap. I am also having her maintain her dorzolamide-timolol, Lumigan, Eye Multivitamin/Lutein, Cyanocobalamin, and brimonidine.  Meds ordered this encounter  Medications   Tdap (BOOSTRIX) 5-2.5-18.5 LF-MCG/0.5 injection    Sig: Inject 0.5 mLs into the muscle once for 1 dose.    Dispense:  0.5 mL    Refill:  0    There are no discontinued medications.  Follow-up: No follow-ups on file.   Crecencio Mc, MD

## 2021-01-31 NOTE — Assessment & Plan Note (Signed)
Improved compared to December . With recurrent hyperkalemia that  Is likely a collection issue since since it has recurred multiple times and  repeat level each time has been normal.  Reminded to avoid use of NSAIDS,  historically  has deferred nephrology referral

## 2021-01-31 NOTE — Assessment & Plan Note (Addendum)
She has been offered staitn therapy in the past and repeats that she has no interest in starting medications given her age.   Lab Results  Component Value Date   CHOL 232 (H) 07/09/2020   HDL 56.80 07/09/2020   LDLCALC 147 (H) 07/09/2020   LDLDIRECT 160.4 05/28/2013   TRIG 142.0 07/09/2020   CHOLHDL 4 07/09/2020

## 2021-07-05 DIAGNOSIS — H401122 Primary open-angle glaucoma, left eye, moderate stage: Secondary | ICD-10-CM | POA: Diagnosis not present

## 2021-07-07 DIAGNOSIS — H90A31 Mixed conductive and sensorineural hearing loss, unilateral, right ear with restricted hearing on the contralateral side: Secondary | ICD-10-CM | POA: Diagnosis not present

## 2021-07-12 ENCOUNTER — Encounter: Payer: Self-pay | Admitting: Internal Medicine

## 2021-07-12 ENCOUNTER — Ambulatory Visit (INDEPENDENT_AMBULATORY_CARE_PROVIDER_SITE_OTHER): Payer: Medicare PPO | Admitting: Internal Medicine

## 2021-07-12 ENCOUNTER — Other Ambulatory Visit: Payer: Self-pay

## 2021-07-12 VITALS — BP 128/70 | HR 68 | Temp 95.6°F | Ht 64.0 in | Wt 132.4 lb

## 2021-07-12 DIAGNOSIS — E559 Vitamin D deficiency, unspecified: Secondary | ICD-10-CM | POA: Diagnosis not present

## 2021-07-12 DIAGNOSIS — N1831 Chronic kidney disease, stage 3a: Secondary | ICD-10-CM | POA: Diagnosis not present

## 2021-07-12 DIAGNOSIS — Z23 Encounter for immunization: Secondary | ICD-10-CM | POA: Diagnosis not present

## 2021-07-12 DIAGNOSIS — E785 Hyperlipidemia, unspecified: Secondary | ICD-10-CM | POA: Diagnosis not present

## 2021-07-12 DIAGNOSIS — Z0000 Encounter for general adult medical examination without abnormal findings: Secondary | ICD-10-CM

## 2021-07-12 DIAGNOSIS — Z7189 Other specified counseling: Secondary | ICD-10-CM | POA: Diagnosis not present

## 2021-07-12 LAB — LIPID PANEL
Cholesterol: 250 mg/dL — ABNORMAL HIGH (ref 0–200)
HDL: 61.8 mg/dL (ref >39.00)
LDL Cholesterol: 167 mg/dL — ABNORMAL HIGH (ref 0–99)
NonHDL: 188.21
Total CHOL/HDL Ratio: 4
Triglycerides: 108 mg/dL (ref 0.0–149.0)
VLDL: 21.6 mg/dL (ref 0.0–40.0)

## 2021-07-12 LAB — COMPREHENSIVE METABOLIC PANEL
ALT: 14 U/L (ref 0–35)
AST: 15 U/L (ref 0–37)
Albumin: 4.3 g/dL (ref 3.5–5.2)
Alkaline Phosphatase: 77 U/L (ref 39–117)
BUN: 17 mg/dL (ref 6–23)
CO2: 26 mEq/L (ref 19–32)
Calcium: 9.7 mg/dL (ref 8.4–10.5)
Chloride: 108 mEq/L (ref 96–112)
Creatinine, Ser: 0.92 mg/dL (ref 0.40–1.20)
GFR: 58.45 mL/min — ABNORMAL LOW (ref >60.00)
Glucose, Bld: 97 mg/dL (ref 70–99)
Potassium: 4.7 mEq/L (ref 3.5–5.1)
Sodium: 141 mEq/L (ref 135–145)
Total Bilirubin: 1.3 mg/dL — ABNORMAL HIGH (ref 0.2–1.2)
Total Protein: 6.6 g/dL (ref 6.0–8.3)

## 2021-07-12 LAB — TSH: TSH: 2.18 u[IU]/mL (ref 0.35–5.50)

## 2021-07-12 LAB — VITAMIN D 25 HYDROXY (VIT D DEFICIENCY, FRACTURES): VITD: 23.79 ng/mL — ABNORMAL LOW (ref 30.00–100.00)

## 2021-07-12 NOTE — Assessment & Plan Note (Addendum)
Stable  . Marland Kitchen  Reminded to avoid use of NSAIDS,  Continues to defer nephrology referral

## 2021-07-12 NOTE — Patient Instructions (Addendum)
You received the "last pneumonia vaccine you will  ever need "  (Prevnar 20) today  The rest of your vaccines are up to date

## 2021-07-12 NOTE — Progress Notes (Addendum)
Patient ID: Maria Christensen, female    DOB: 1941/06/26  Age: 80 y.o. MRN: [id number]  The patient is here for annual preventive   examination and management of other chronic and acute problems.   The risk factors are reflected in the social history.  The roster of all physicians providing medical care to patient - is listed in the Snapshot section of the chart.  Activities of daily living:  The patient is 100% independent in all ADLs: dressing, toileting, feeding as well as independent mobility. HAS M[place] TO [place]   Home safety : The patient has smoke detectors in the home. They wear seatbelts.  There are no firearms at home. There is no violence in the home.   There is no risks for hepatitis, STDs or HIV. There is no   history of blood transfusion. They have no travel history to infectious disease endemic areas of the world.  The patient has seen their dentist in the last six month. They have seen their eye doctor in the last year. They admit to slight hearing difficulty with regard to whispered voices and some television programs.  They have deferred audiologic testing in the last year.  They do not  have excessive sun exposure. Discussed the need for sun protection: hats, long sleeves and use of sunscreen if there is significant sun exposure.   Diet: the importance of a healthy diet is discussed. They do have a healthy diet.  The benefits of regular aerobic exercise were discussed. She walks 4 times per week ,  20 minutes.   Depression screen: there are no signs or vegative symptoms of depression- irritability, change in appetite, anhedonia, sadness/tearfullness.  Cognitive assessment: the patient manages all their financial and personal affairs and is actively engaged. They could relate day,date,year and events; recalled 2/3 objects at 3 minutes; performed clock-face test normally.  The following portions of the patient's history were reviewed and updated as appropriate:  allergies, current medications, past family history, past medical history,  past surgical history, past social history  and problem list.  Visual acuity was not assessed per patient preference since she has regular follow up with her ophthalmologist. Hearing and body mass index were assessed and reviewed.   During the course of the visit the patient was educated and counseled about appropriate screening and preventive services including : fall prevention , diabetes screening, nutrition counseling, colorectal cancer screening, and recommended immunizations.    CC: The primary encounter diagnosis was Encounter for preventive health examination. Diagnoses of Vitamin D deficiency, Stage 3a chronic kidney disease ([place]), Hyperlipidemia with target LDL less than 160, Need for pneumococcal vaccination, and Do not resuscitate discussion were also pertinent to this visit.  Starting in July she started having  several weeks of anxiety ,  sleeplessness, anorexia,  palpitations,  attributed to anticipated move to [place].  Have resolved since the move. She lost 9 lbs during the move, but has gained back 2  lbs since November move in.  Has several forms that require completion or review for [place]  Has DNR order in place.  MOST orders reviewed and renewed .  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   She has a past surgical history that includes Abdominal hysterectomy; Bunionectomy (2002); Cleft palate  repair; Cataract extraction w/PHACO (Left, 04/01/2015); and Cataract extraction w/PHACO (Right, 12/16/2020).   Her family history includes Cancer in her father; Diabetes in her brother; Glaucoma (age of onset: 62) in her brother; Heart attack in her brother;  Hypertension in her brother; Osteoporosis in her mother.She reports that she quit smoking about 59 years ago. Her smoking use included cigarettes. She has never used smokeless tobacco. She reports that she does not drink alcohol and does not use drugs.  Outpatient Medications Prior to Visit  Medication Sig Dispense Refill   brimonidine (ALPHAGAN P) 0.1 % SOLN Place 1 drop into both eyes 2 (two) times daily.     Cyanocobalamin 1000 MCG SUBL Place 1 tablet (1,000 mcg total) under the tongue daily. 90 tablet 1   dorzolamide-timolol (COSOPT) 22.3-6.8 MG/ML ophthalmic solution Place 1 drop into both eyes 2 (two) times daily.      LUMIGAN 0.01 % SOLN Place 1 drop into both eyes at bedtime.      Multiple Vitamins-Minerals (EYE MULTIVITAMIN/LUTEIN) TABS Take 1 tablet by mouth daily.     No facility-administered medications prior to visit.    Review of Systems  Patient denies headache, fevers, malaise, unintentional weight loss, skin rash, eye pain, sinus congestion and sinus pain, sore throat, dysphagia,  hemoptysis , cough, dyspnea, wheezing, chest pain, palpitations, orthopnea, edema, abdominal pain, nausea, melena, diarrhea, constipation, flank pain, dysuria, hematuria, urinary  Frequency, nocturia, numbness, tingling, seizures,  Focal weakness, Loss of consciousness,  Tremor, insomnia, depression, anxiety, and suicidal ideation.     Objective:  BP 128/70 (BP Location: Left Arm, Patient Position: Sitting, Cuff Size: Normal)   Pulse 68   Temp (!) 95.6 F (35.3 C) (Temporal)   Ht 5\' 4"  (1.626 m)   Wt 132 lb 6.4 oz (60.1 kg)   SpO2 97%   BMI 22.73 kg/m   Physical Exam   General appearance: alert, cooperative and appears stated age Ears: normal TM's and external ear canals both ears Throat: lips, mucosa, and tongue normal; teeth and gums normal Neck: no adenopathy, no carotid bruit, supple, symmetrical, trachea midline and thyroid not enlarged, symmetric, no tenderness/mass/nodules Back:  symmetric, no curvature. ROM normal. No CVA tenderness. Lungs: clear to auscultation bilaterally Heart: regular rate and rhythm, S1, S2 normal, no murmur, click, rub or gallop Abdomen: soft, non-tender; bowel sounds normal; no masses,  no organomegaly Pulses: 2+ and symmetric Skin: Skin color, texture, turgor normal. No rashes or lesions Lymph nodes: Cervical, supraclavicular, and axillary nodes normal.  Assessment & Plan:   Problem List Items Addressed This Visit     CKD (chronic kidney disease) stage 3, GFR 30-59 ml/min (HCC)    Stable  . Marland Kitchen  Reminded to avoid use of NSAIDS,  Continues to defer nephrology referral      Relevant Orders   Comprehensive metabolic panel (Completed)   Do not resuscitate discussion    She has a standing DNR order,  And MOST form which was reviewed today and renewed       Encounter for preventive health examination - Primary    age appropriate education and counseling updated, referrals for preventative services and immunizations addressed, dietary and smoking counseling addressed, most recent labs reviewed.  I have personally reviewed and have noted:   1) the patient's medical and social history 2) The pt's use of alcohol, tobacco, and illicit drugs 3) The patient's current medications and supplements 4) Functional ability including ADL's, fall risk, home safety risk, hearing and visual impairment 5)  Diet and physical activities 6) Evidence for depression or mood disorder 7) The patient's height, weight, and BMI have been recorded in the chart   I have made referrals, and provided counseling and education based on review of the above      Hyperlipidemia with target LDL less than 160    She has been offered statin  therapy in the past and repeats that she has no interest in starting medications given her age.   Lab Results  Component Value Date   CHOL 250 (H) 07/12/2021   HDL 61.80 07/12/2021   LDLCALC 167 (H) 07/12/2021   LDLDIRECT 160.4  05/28/2013   TRIG 108.0 07/12/2021   CHOLHDL 4 07/12/2021         Relevant Orders   Lipid panel (Completed)   TSH (Completed)   Vitamin D deficiency    Her  vitamin D is low, which can increase your risk of weak bones and fractures and interfere with your body's ability to absorb the calcium in your diet.   I am calling in a megadose of Vit D to take once weekly for a total of 3 months followed by continued supplementation with OTC Vit D3 2000 IU's daily       Relevant Orders   VITAMIN D 25 Hydroxy (Vit-D Deficiency, Fractures) (Completed)   Other Visit Diagnoses     Need for pneumococcal vaccination       Relevant Orders   Pneumococcal conjugate vaccine 20-valent (Prevnar 20) (Completed)       I am having Maricela Bo. Bells start on ergocalciferol. I am also having her maintain her dorzolamide-timolol, Lumigan, Eye Multivitamin/Lutein, Cyanocobalamin, and brimonidine.  Meds ordered this encounter  Medications   ergocalciferol (DRISDOL) 1.25 MG (50000 UT) capsule    Sig: Take 1 capsule (50,000 Units total) by mouth once a week.    Dispense:  12 capsule    Refill:  0     There are no discontinued medications.  Follow-up: Return in about 1 year (around 07/12/2022).   Crecencio Mc, MD

## 2021-07-12 NOTE — Assessment & Plan Note (Signed)
She has a standing DNR order,  And MOST form which was reviewed today and renewed

## 2021-07-12 NOTE — Assessment & Plan Note (Signed)

## 2021-07-13 ENCOUNTER — Encounter: Payer: Self-pay | Admitting: Internal Medicine

## 2021-07-13 DIAGNOSIS — E785 Hyperlipidemia, unspecified: Secondary | ICD-10-CM

## 2021-07-13 DIAGNOSIS — E559 Vitamin D deficiency, unspecified: Secondary | ICD-10-CM | POA: Insufficient documentation

## 2021-07-13 MED ORDER — ERGOCALCIFEROL 1.25 MG (50000 UT) PO CAPS: 50000.0000 [IU] | ORAL_CAPSULE | ORAL | 0 refills | Status: DC

## 2021-07-13 NOTE — Addendum Note (Signed)
Addended by: Crecencio Mc on: 07/13/2021 10:44 AM   Modules accepted: Orders

## 2021-07-13 NOTE — Assessment & Plan Note (Signed)
She has been offered statin  therapy in the past and repeats that she has no interest in starting medications given her age.   Lab Results  Component Value Date   CHOL 250 (H) 07/12/2021   HDL 61.80 07/12/2021   LDLCALC 167 (H) 07/12/2021   LDLDIRECT 160.4 05/28/2013   TRIG 108.0 07/12/2021   CHOLHDL 4 07/12/2021

## 2021-07-13 NOTE — Assessment & Plan Note (Signed)
Her  vitamin D is low, which can increase your risk of weak bones and fractures and interfere with your body's ability to absorb the calcium in your diet.   I am calling in a megadose of Vit D to take once weekly for a total of 3 months followed by continued supplementation with OTC Vit D3 2000 IU's daily

## 2021-07-14 MED ORDER — ROSUVASTATIN CALCIUM 5 MG PO TABS: 5.0000 mg | ORAL_TABLET | Freq: Every day | ORAL | 0 refills | Status: DC

## 2021-07-16 ENCOUNTER — Telehealth: Payer: Self-pay | Admitting: Internal Medicine

## 2021-07-18 ENCOUNTER — Encounter: Payer: Self-pay | Admitting: Internal Medicine

## 2021-08-12 ENCOUNTER — Other Ambulatory Visit (INDEPENDENT_AMBULATORY_CARE_PROVIDER_SITE_OTHER): Payer: Medicare PPO

## 2021-08-12 ENCOUNTER — Encounter: Payer: Self-pay | Admitting: Internal Medicine

## 2021-08-12 ENCOUNTER — Other Ambulatory Visit: Payer: Self-pay

## 2021-08-12 DIAGNOSIS — E785 Hyperlipidemia, unspecified: Secondary | ICD-10-CM | POA: Diagnosis not present

## 2021-08-12 DIAGNOSIS — E875 Hyperkalemia: Secondary | ICD-10-CM

## 2021-08-12 LAB — COMPREHENSIVE METABOLIC PANEL
ALT: 14 U/L (ref 0–35)
AST: 15 U/L (ref 0–37)
Albumin: 4 g/dL (ref 3.5–5.2)
Alkaline Phosphatase: 75 U/L (ref 39–117)
BUN: 15 mg/dL (ref 6–23)
CO2: 27 mEq/L (ref 19–32)
Calcium: 9.3 mg/dL (ref 8.4–10.5)
Chloride: 108 mEq/L (ref 96–112)
Creatinine, Ser: 0.97 mg/dL (ref 0.40–1.20)
GFR: 54.82 mL/min — ABNORMAL LOW (ref >60.00)
Glucose, Bld: 97 mg/dL (ref 70–99)
Potassium: 5.3 mEq/L — ABNORMAL HIGH (ref 3.5–5.1)
Sodium: 140 mEq/L (ref 135–145)
Total Bilirubin: 1.4 mg/dL — ABNORMAL HIGH (ref 0.2–1.2)
Total Protein: 6.2 g/dL (ref 6.0–8.3)

## 2021-08-12 NOTE — Addendum Note (Signed)
Addended by: Crecencio Mc on: 08/12/2021 09:46 PM   Modules accepted: Orders

## 2021-08-13 ENCOUNTER — Other Ambulatory Visit: Payer: Self-pay | Admitting: Internal Medicine

## 2021-08-13 DIAGNOSIS — E538 Deficiency of other specified B group vitamins: Secondary | ICD-10-CM

## 2021-08-16 ENCOUNTER — Encounter: Payer: Self-pay | Admitting: Dermatology

## 2021-08-16 ENCOUNTER — Other Ambulatory Visit: Payer: Self-pay

## 2021-08-16 ENCOUNTER — Ambulatory Visit: Payer: Medicare PPO | Admitting: Dermatology

## 2021-08-16 DIAGNOSIS — L820 Inflamed seborrheic keratosis: Secondary | ICD-10-CM | POA: Diagnosis not present

## 2021-08-16 DIAGNOSIS — L578 Other skin changes due to chronic exposure to nonionizing radiation: Secondary | ICD-10-CM | POA: Diagnosis not present

## 2021-08-16 DIAGNOSIS — L720 Epidermal cyst: Secondary | ICD-10-CM | POA: Diagnosis not present

## 2021-08-16 NOTE — Progress Notes (Signed)
° °  Follow-Up Visit   Subjective  Maria Christensen is a 81 y.o. female who presents for the following: Follow-up (Recheck AK. Tx with LN2 08/2020. Other areas treated on face have resolved. ). The patient has spots, moles and lesions to be evaluated, some may be new or changing and the patient has concerns that these could be cancer.  The following portions of the chart were reviewed this encounter and updated as appropriate:  Tobacco   Allergies   Meds   Problems   Med Hx   Surg Hx   Fam Hx      Review of Systems: No other skin or systemic complaints except as noted in HPI or Assessment and Plan.  Objective  Well appearing patient in no apparent distress; mood and affect are within normal limits.  A focused examination was performed including head, including the scalp, face, neck, nose, ears, eyelids, and lips and face, hands. Relevant physical exam findings are noted in the Assessment and Plan.  Left Forehead above eyebrow x1 Erythematous keratotic or waxy stuck-on papule or plaque.  Left Nasal Sidewall Subcutaneous papule   Assessment & Plan  Inflamed seborrheic keratosis Left Forehead above eyebrow x1  Destruction of lesion - Left Forehead above eyebrow x1 Complexity: simple   Destruction method: cryotherapy   Informed consent: discussed and consent obtained   Timeout:  patient name, date of birth, surgical site, and procedure verified Lesion destroyed using liquid nitrogen: Yes   Region frozen until ice ball extended beyond lesion: Yes   Outcome: patient tolerated procedure well with no complications   Post-procedure details: wound care instructions given    Epidermal cyst Left Nasal Sidewall Benign-appearing. Exam most consistent with an epidermal inclusion cyst. Discussed that a cyst is a benign growth that can grow over time and sometimes get irritated or inflamed. Recommend observation if it is not bothersome. Discussed option of surgical excision to remove it if it is  growing, symptomatic, or other changes noted. Please call for new or changing lesions so they can be evaluated.  Actinic Damage - chronic, secondary to cumulative UV radiation exposure/sun exposure over time - diffuse scaly erythematous macules with underlying dyspigmentation - Recommend daily broad spectrum sunscreen SPF 30+ to sun-exposed areas, reapply every 2 hours as needed.  - Recommend staying in the shade or wearing long sleeves, sun glasses (UVA+UVB protection) and wide brim hats (4-inch brim around the entire circumference of the hat). - Call for new or changing lesions.  Return in about 1 year (around 08/16/2022) for TBSE.  I, Emelia Salisbury, CMA, am acting as scribe for Sarina Ser, MD. Documentation: I have reviewed the above documentation for accuracy and completeness, and I agree with the above.  Sarina Ser, MD

## 2021-08-16 NOTE — Patient Instructions (Addendum)
Cryotherapy Aftercare  Wash gently with soap and water everyday.   Apply Vaseline and Band-Aid daily until healed.   Prior to procedure, discussed risks of blister formation, small wound, skin dyspigmentation, or rare scar following cryotherapy. Recommend Vaseline ointment to treated areas while healing.    If You Need Anything After Your Visit  If you have any questions or concerns for your doctor, please call our main line at [phone] and press option 4 to reach your doctor's medical assistant. If no one answers, please leave a voicemail as directed and we will return your call as soon as possible. Messages left after 4 pm will be answered the following business day.   You may also send [place] a message via [place]. We typically respond to MyChart messages within 1-2 business days.  For prescription refills, please ask your pharmacy to contact our office. Our fax number is [phone].  If you have an urgent issue when the clinic is closed that cannot wait until the next business day, you can page your doctor at the number below.    Please note that while we do our best to be available for urgent issues outside of office hours, we are not available 24/7.   If you have an urgent issue and are unable to reach [place], you may choose to seek medical care at your doctor's office, retail clinic, urgent care center, or emergency room.  If you have a medical emergency, please immediately call 911 or go to the emergency department.  Pager Numbers  - Dr. Nehemiah Massed: [phone]  - Dr. Laurence Ferrari: [phone]  - Dr. Nicole Kindred: [phone]  In the event of inclement weather, please call our main line at [phone] for an update on the status of any delays or closures.  Dermatology Medication Tips: Please keep the boxes that topical medications come in in order to help keep track of the instructions about where and how to use these. Pharmacies typically print the medication instructions only on the  boxes and not directly on the medication tubes.   If your medication is too expensive, please contact our office at [phone] option 4 or send [place] a message through [place].   We are unable to tell what your co-pay for medications will be in advance as this is different depending on your insurance coverage. However, we may be able to find a substitute medication at lower cost or fill out paperwork to get insurance to cover a needed medication.   If a prior authorization is required to get your medication covered by your insurance company, please allow [place] 1-2 business days to complete this process.  Drug prices often vary depending on where the prescription is filled and some pharmacies may offer cheaper prices.  The website www.goodrx.com contains coupons for medications through different pharmacies. The prices here do not account for what the cost may be with help from insurance (it may be cheaper with your insurance), but the website can give you the price if you did not use any insurance.  - You can print the associated coupon and take it with your prescription to the pharmacy.  - You may also stop by our office during regular business hours and pick up a GoodRx coupon card.  - If you need your prescription sent electronically to a different pharmacy, notify our office through [facility] or by phone at [phone] option 4.     Si Usted Necesita Algo Despus de Su Visita  Tambin puede enviarnos un mensaje a  travs de MyChart. Por lo general respondemos a los mensajes de MyChart en el transcurso de 1 a 2 das hbiles.  Para renovar recetas, por favor pida a su farmacia que se ponga en contacto con nuestra oficina. Harland Dingwall de fax es [place] [phone].  Si tiene un asunto urgente cuando la clnica est cerrada y que no puede esperar hasta el siguiente da hbil, puede llamar/localizar a su doctor(a) al nmero que aparece a continuacin.   Por favor, tenga en cuenta que  aunque hacemos todo lo posible para estar disponibles para asuntos urgentes fuera del horario de [place], no estamos disponibles las 24 horas del da, los 7 das de la [place].   Si tiene un problema urgente y no puede comunicarse con nosotros, puede optar por buscar atencin mdica  en el consultorio de su doctor(a), en una clnica privada, en un centro de atencin urgente o en una sala de emergencias.  Si tiene Engineering geologist, por favor llame inmediatamente al 911 o vaya a la sala de emergencias.  Nmeros de bper  - Dr. Nehemiah Massed: [phone]  - Dra. Moye: [phone]  - Dra. Nicole Kindred: [phone]  En caso de inclemencias del [place], por favor llame a Johnsie Kindred principal al [phone] para una actualizacin sobre el [place] de cualquier retraso o cierre.  Consejos para la medicacin en dermatologa: Por favor, guarde las cajas en las que vienen los medicamentos de uso tpico para ayudarle a seguir las instrucciones sobre dnde y cmo usarlos. Las farmacias generalmente imprimen las instrucciones del medicamento slo en las cajas y no directamente en los tubos del [place].   Si su medicamento es muy caro, por favor, pngase en contacto con Zigmund Daniel llamando al [phone] y presione la opcin 4 o envenos un mensaje a travs de Pharmacist, community.   No podemos decirle cul ser su copago por los medicamentos por adelantado ya que esto es diferente dependiendo de la cobertura de su seguro. Sin embargo, es posible que podamos encontrar un medicamento sustituto a Electrical engineer un formulario para que el seguro cubra el medicamento que se considera necesario.   Si se requiere una autorizacin previa para que su compaa de seguros [place] su medicamento, por favor permtanos de 1 a 2 das hbiles para completar este proceso.  Los precios de los medicamentos varan con frecuencia dependiendo del Environmental consultant de dnde se surte la receta y alguna farmacias pueden ofrecer precios ms  baratos.  El sitio web www.goodrx.com tiene cupones para medicamentos de Airline pilot. Los precios aqu no tienen en cuenta lo que podra costar con la ayuda del seguro (puede ser ms barato con su seguro), pero el sitio web puede darle el precio si no utiliz Research scientist (physical sciences).  - Puede imprimir el cupn correspondiente y llevarlo con su receta a la farmacia.  - Tambin puede pasar por nuestra oficina durante el horario de atencin regular y Charity fundraiser una tarjeta de cupones de GoodRx.  - Si necesita que su receta se enve electrnicamente a una farmacia diferente, informe a nuestra oficina a travs de MyChart de [place] o por telfono llamando al [phone] y presione la opcin 4.

## 2021-08-17 ENCOUNTER — Encounter: Payer: Self-pay | Admitting: Dermatology

## 2021-08-24 NOTE — Telephone Encounter (Signed)
Error

## 2021-08-26 ENCOUNTER — Other Ambulatory Visit (INDEPENDENT_AMBULATORY_CARE_PROVIDER_SITE_OTHER): Payer: Medicare PPO

## 2021-08-26 ENCOUNTER — Other Ambulatory Visit: Payer: Self-pay

## 2021-08-26 DIAGNOSIS — E538 Deficiency of other specified B group vitamins: Secondary | ICD-10-CM

## 2021-08-26 DIAGNOSIS — E875 Hyperkalemia: Secondary | ICD-10-CM | POA: Diagnosis not present

## 2021-08-26 LAB — COMPREHENSIVE METABOLIC PANEL
ALT: 12 U/L (ref 0–35)
AST: 15 U/L (ref 0–37)
Albumin: 4.2 g/dL (ref 3.5–5.2)
Alkaline Phosphatase: 70 U/L (ref 39–117)
BUN: 15 mg/dL (ref 6–23)
CO2: 26 mEq/L (ref 19–32)
Calcium: 9.4 mg/dL (ref 8.4–10.5)
Chloride: 108 mEq/L (ref 96–112)
Creatinine, Ser: 0.93 mg/dL (ref 0.40–1.20)
GFR: 57.65 mL/min — ABNORMAL LOW (ref >60.00)
Glucose, Bld: 81 mg/dL (ref 70–99)
Potassium: 4.9 mEq/L (ref 3.5–5.1)
Sodium: 141 mEq/L (ref 135–145)
Total Bilirubin: 2.5 mg/dL — ABNORMAL HIGH (ref 0.2–1.2)
Total Protein: 6.3 g/dL (ref 6.0–8.3)

## 2021-08-26 LAB — VITAMIN B12: Vitamin B-12: >1550 pg/mL — ABNORMAL HIGH (ref 211–911)

## 2021-08-27 ENCOUNTER — Encounter: Payer: Self-pay | Admitting: Internal Medicine

## 2021-08-29 ENCOUNTER — Other Ambulatory Visit: Payer: Self-pay | Admitting: Internal Medicine

## 2021-08-29 DIAGNOSIS — E785 Hyperlipidemia, unspecified: Secondary | ICD-10-CM

## 2021-08-29 MED ORDER — ROSUVASTATIN CALCIUM 5 MG PO TABS: 5.0000 mg | ORAL_TABLET | Freq: Every day | ORAL | 0 refills | Status: DC

## 2021-09-01 ENCOUNTER — Encounter: Payer: Medicare PPO | Admitting: Dermatology

## 2021-10-02 ENCOUNTER — Encounter: Payer: Self-pay | Admitting: Internal Medicine

## 2021-10-02 DIAGNOSIS — E785 Hyperlipidemia, unspecified: Secondary | ICD-10-CM

## 2021-10-05 MED ORDER — ROSUVASTATIN CALCIUM 5 MG PO TABS: 5.0000 mg | ORAL_TABLET | Freq: Every day | ORAL | 0 refills | Status: DC

## 2021-10-05 NOTE — Addendum Note (Signed)
Addended by: Adair Laundry on: 10/05/2021 08:49 AM   Modules accepted: Orders

## 2021-10-06 ENCOUNTER — Encounter: Payer: Self-pay | Admitting: Internal Medicine

## 2021-10-06 ENCOUNTER — Other Ambulatory Visit: Payer: Self-pay

## 2022-01-03 ENCOUNTER — Encounter: Payer: Self-pay | Admitting: Internal Medicine

## 2022-01-03 DIAGNOSIS — E785 Hyperlipidemia, unspecified: Secondary | ICD-10-CM

## 2022-01-04 ENCOUNTER — Other Ambulatory Visit: Payer: Self-pay

## 2022-01-04 MED ORDER — ROSUVASTATIN CALCIUM 5 MG PO TABS: 5.0000 mg | ORAL_TABLET | Freq: Every day | ORAL | 1 refills | Status: DC

## 2022-01-04 NOTE — Telephone Encounter (Signed)
I do not see in chart where pt was advised to come back in in July to have labs rechecked. Is there certain labs that she needs to have done?

## 2022-01-05 NOTE — Telephone Encounter (Signed)
She needs a cmet every 6 months for ongoing statin use

## 2022-01-24 ENCOUNTER — Encounter: Payer: Self-pay | Admitting: Internal Medicine

## 2022-01-27 DIAGNOSIS — H401122 Primary open-angle glaucoma, left eye, moderate stage: Secondary | ICD-10-CM | POA: Diagnosis not present

## 2022-02-07 ENCOUNTER — Other Ambulatory Visit (INDEPENDENT_AMBULATORY_CARE_PROVIDER_SITE_OTHER): Payer: Medicare PPO

## 2022-02-07 DIAGNOSIS — E785 Hyperlipidemia, unspecified: Secondary | ICD-10-CM

## 2022-02-07 LAB — COMPREHENSIVE METABOLIC PANEL
ALT: 10 U/L (ref 0–35)
AST: 14 U/L (ref 0–37)
Albumin: 4.3 g/dL (ref 3.5–5.2)
Alkaline Phosphatase: 56 U/L (ref 39–117)
BUN: 17 mg/dL (ref 6–23)
CO2: 21 mEq/L (ref 19–32)
Calcium: 9.9 mg/dL (ref 8.4–10.5)
Chloride: 110 mEq/L (ref 96–112)
Creatinine, Ser: 1 mg/dL (ref 0.40–1.20)
GFR: 52.67 mL/min — ABNORMAL LOW (ref >60.00)
Glucose, Bld: 120 mg/dL — ABNORMAL HIGH (ref 70–99)
Potassium: 4.4 mEq/L (ref 3.5–5.1)
Sodium: 140 mEq/L (ref 135–145)
Total Bilirubin: 1.9 mg/dL — ABNORMAL HIGH (ref 0.2–1.2)
Total Protein: 6.2 g/dL (ref 6.0–8.3)

## 2022-02-14 ENCOUNTER — Encounter: Payer: Self-pay | Admitting: Internal Medicine

## 2022-02-15 ENCOUNTER — Telehealth: Payer: Self-pay

## 2022-02-15 DIAGNOSIS — E785 Hyperlipidemia, unspecified: Secondary | ICD-10-CM

## 2022-02-15 DIAGNOSIS — R7301 Impaired fasting glucose: Secondary | ICD-10-CM

## 2022-02-15 DIAGNOSIS — R5383 Other fatigue: Secondary | ICD-10-CM

## 2022-02-15 DIAGNOSIS — D582 Other hemoglobinopathies: Secondary | ICD-10-CM

## 2022-02-15 DIAGNOSIS — N1831 Chronic kidney disease, stage 3a: Secondary | ICD-10-CM

## 2022-02-15 NOTE — Telephone Encounter (Signed)
Patient states she would like to have labs before her yearly follow-up on 07/13/2022.  I don't see an order for labs in Epic.  Patient states she would like to have the labs drawn on 07/08/2022 before her appointment.  Patient asked that we please call her to schedule.

## 2022-02-16 NOTE — Telephone Encounter (Signed)
LMTCB. Please schedule pt a fasting lab appt on 07/08/2022 per pt request. Labs have been ordered.

## 2022-02-16 NOTE — Telephone Encounter (Signed)
Pt returning call... Schedule pt for labs... Pt understood.Marland KitchenMarland Kitchen

## 2022-02-16 NOTE — Telephone Encounter (Signed)
See telephone encounter.

## 2022-02-16 NOTE — Telephone Encounter (Signed)
noted 

## 2022-02-28 DIAGNOSIS — H90A31 Mixed conductive and sensorineural hearing loss, unilateral, right ear with restricted hearing on the contralateral side: Secondary | ICD-10-CM | POA: Diagnosis not present

## 2022-02-28 DIAGNOSIS — H6123 Impacted cerumen, bilateral: Secondary | ICD-10-CM | POA: Diagnosis not present

## 2022-04-06 ENCOUNTER — Encounter: Payer: Self-pay | Admitting: Internal Medicine

## 2022-04-06 DIAGNOSIS — E785 Hyperlipidemia, unspecified: Secondary | ICD-10-CM

## 2022-04-06 MED ORDER — ROSUVASTATIN CALCIUM 5 MG PO TABS: 5.0000 mg | ORAL_TABLET | Freq: Every day | ORAL | 1 refills | Status: DC

## 2022-05-04 ENCOUNTER — Encounter: Payer: Self-pay | Admitting: Internal Medicine

## 2022-05-04 NOTE — Telephone Encounter (Signed)
If you have tolerated the previous boosters without serious side effects, you should consider getting the new COVID booster when it is available.  I do recommend the RSV vaccine for you, .  It is now available through the office If you are younger than 34. You can also check with [facility], [facility] and [facility]     Regards,   Deborra Medina, MD

## 2022-05-06 ENCOUNTER — Encounter: Payer: Self-pay | Admitting: Internal Medicine

## 2022-05-08 ENCOUNTER — Encounter: Payer: Self-pay | Admitting: Internal Medicine

## 2022-05-18 ENCOUNTER — Encounter: Payer: Self-pay | Admitting: Internal Medicine

## 2022-07-08 ENCOUNTER — Other Ambulatory Visit (INDEPENDENT_AMBULATORY_CARE_PROVIDER_SITE_OTHER): Payer: Medicare PPO

## 2022-07-08 DIAGNOSIS — D582 Other hemoglobinopathies: Secondary | ICD-10-CM | POA: Diagnosis not present

## 2022-07-08 DIAGNOSIS — E785 Hyperlipidemia, unspecified: Secondary | ICD-10-CM | POA: Diagnosis not present

## 2022-07-08 DIAGNOSIS — R7301 Impaired fasting glucose: Secondary | ICD-10-CM

## 2022-07-08 DIAGNOSIS — N1831 Chronic kidney disease, stage 3a: Secondary | ICD-10-CM

## 2022-07-08 DIAGNOSIS — R5383 Other fatigue: Secondary | ICD-10-CM | POA: Diagnosis not present

## 2022-07-08 LAB — LIPID PANEL
Cholesterol: 132 mg/dL (ref 0–200)
HDL: 50.7 mg/dL (ref >39.00)
LDL Cholesterol: 56 mg/dL (ref 0–99)
NonHDL: 81.43
Total CHOL/HDL Ratio: 3
Triglycerides: 127 mg/dL (ref 0.0–149.0)
VLDL: 25.4 mg/dL (ref 0.0–40.0)

## 2022-07-08 LAB — CBC WITH DIFFERENTIAL/PLATELET
Basophils Absolute: 0 10*3/uL (ref 0.0–0.1)
Basophils Relative: 0.7 % (ref 0.0–3.0)
Eosinophils Absolute: 0.1 10*3/uL (ref 0.0–0.7)
Eosinophils Relative: 1.6 % (ref 0.0–5.0)
HCT: 43 % (ref 36.0–46.0)
Hemoglobin: 14.8 g/dL (ref 12.0–15.0)
Lymphocytes Relative: 18.8 % (ref 12.0–46.0)
Lymphs Abs: 1.1 10*3/uL (ref 0.7–4.0)
MCHC: 34.4 g/dL (ref 30.0–36.0)
MCV: 90.5 fl (ref 78.0–100.0)
Monocytes Absolute: 0.5 10*3/uL (ref 0.1–1.0)
Monocytes Relative: 8.7 % (ref 3.0–12.0)
Neutro Abs: 4 10*3/uL (ref 1.4–7.7)
Neutrophils Relative %: 70.2 % (ref 43.0–77.0)
Platelets: 176 10*3/uL (ref 150.0–400.0)
RBC: 4.75 Mil/uL (ref 3.87–5.11)
RDW: 13.5 % (ref 11.5–15.5)
WBC: 5.7 10*3/uL (ref 4.0–10.5)

## 2022-07-08 LAB — COMPREHENSIVE METABOLIC PANEL
ALT: 12 U/L (ref 0–35)
AST: 16 U/L (ref 0–37)
Albumin: 4.3 g/dL (ref 3.5–5.2)
Alkaline Phosphatase: 56 U/L (ref 39–117)
BUN: 13 mg/dL (ref 6–23)
CO2: 25 mEq/L (ref 19–32)
Calcium: 9.4 mg/dL (ref 8.4–10.5)
Chloride: 110 mEq/L (ref 96–112)
Creatinine, Ser: 0.87 mg/dL (ref 0.40–1.20)
GFR: 62.07 mL/min (ref >60.00)
Glucose, Bld: 103 mg/dL — ABNORMAL HIGH (ref 70–99)
Potassium: 4.3 mEq/L (ref 3.5–5.1)
Sodium: 143 mEq/L (ref 135–145)
Total Bilirubin: 1.9 mg/dL — ABNORMAL HIGH (ref 0.2–1.2)
Total Protein: 6.3 g/dL (ref 6.0–8.3)

## 2022-07-08 LAB — MICROALBUMIN / CREATININE URINE RATIO
Creatinine,U: 147.8 mg/dL
Microalb Creat Ratio: 1.9 mg/g (ref 0.0–30.0)
Microalb, Ur: 2.9 mg/dL — ABNORMAL HIGH (ref 0.0–1.9)

## 2022-07-08 LAB — LDL CHOLESTEROL, DIRECT: Direct LDL: 61 mg/dL

## 2022-07-08 LAB — TSH: TSH: 1.69 u[IU]/mL (ref 0.35–5.50)

## 2022-07-08 LAB — HEMOGLOBIN A1C: Hgb A1c MFr Bld: 6.1 % (ref 4.6–6.5)

## 2022-07-13 ENCOUNTER — Ambulatory Visit: Payer: Medicare PPO | Admitting: Internal Medicine

## 2022-07-13 ENCOUNTER — Encounter: Payer: Self-pay | Admitting: Internal Medicine

## 2022-07-13 VITALS — BP 132/82 | HR 77 | Temp 97.7°F | Ht 64.0 in | Wt 139.8 lb

## 2022-07-13 DIAGNOSIS — I10 Essential (primary) hypertension: Secondary | ICD-10-CM | POA: Insufficient documentation

## 2022-07-13 DIAGNOSIS — R030 Elevated blood-pressure reading, without diagnosis of hypertension: Secondary | ICD-10-CM | POA: Diagnosis not present

## 2022-07-13 DIAGNOSIS — E7849 Other hyperlipidemia: Secondary | ICD-10-CM

## 2022-07-13 DIAGNOSIS — E785 Hyperlipidemia, unspecified: Secondary | ICD-10-CM | POA: Diagnosis not present

## 2022-07-13 DIAGNOSIS — R7303 Prediabetes: Secondary | ICD-10-CM | POA: Diagnosis not present

## 2022-07-13 DIAGNOSIS — Z0000 Encounter for general adult medical examination without abnormal findings: Secondary | ICD-10-CM

## 2022-07-13 NOTE — Patient Instructions (Addendum)
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  Microscopic amounts of protein in the urine can mean that diabetes or hypertension is starting to affect your kidney's ability to  filter protein out of the urine .Please ask the [facility] nurse to check BP 1-2 times per week and send me readings in a few weeks .  Goal for BP is 120/70 to < 130/80   if they are elevated I will recommend that you  start a medication for blood pressure called losartan.  The losartan will help to prevent this from getting worse.    Your  fasting glucose has never been  diagnostic of diabetes; but your A1c of 6.1 suggests that you may be at  slightly increased  risk for developing type 2 Diabetes over the next ten years.  Reducing your daily intake of starches to 2 servings daily , eliminating refined sugars and continuing  a daily exercise regimen  may be able to delay the progression to diabetes.   I would like to repeat NON FASTING labs In 6 months.      We will fax the labs and OV note to the [place] nurse [phone]

## 2022-07-13 NOTE — Assessment & Plan Note (Signed)

## 2022-07-13 NOTE — Assessment & Plan Note (Signed)
She is tolerating rosuvastatin with excellent reduction in LDL and liver enzymes are stable  Lab Results  Component Value Date   CHOL 132 07/08/2022   HDL 50.70 07/08/2022   LDLCALC 56 07/08/2022   LDLDIRECT 61.0 07/08/2022   TRIG 127.0 07/08/2022   CHOLHDL 3 07/08/2022

## 2022-07-13 NOTE — Assessment & Plan Note (Signed)
Her  random glucose is not  elevated but her A1c suggests she is at risk for developing diabetes.  I recommend she follow a low glycemic index diet and particpate regularly in an aerobic  exercise activity.  We should check an A1c in 6 months.

## 2022-07-13 NOTE — Assessment & Plan Note (Signed)
She  has no prior history of hypertension. She  will check his blood pressure several times over the next 3-4 weeks and to submit readings for evaluation. Urine microalbumin to creatinine ratio done today is positive for early proteinuria , but CR has improved.   Lab Results  Component Value Date   MICROALBUR 2.9 (H) 07/08/2022   MICROALBUR 1.3 10/09/2020   Lab Results  Component Value Date   CREATININE 0.87 07/08/2022

## 2022-07-13 NOTE — Progress Notes (Unsigned)
Patient ID: Maria Christensen, female    DOB: Oct 13, 1940  Age: 81 y.o. MRN: [id number]  The patient is here for follow up and  management of other chronic and acute problems.   The risk factors are reflected in the social history.  The roster of all physicians providing medical care to patient - is listed in the Snapshot section of the chart.  Activities of daily living:  The patient is 100% independent in all ADLs: dressing, toileting, feeding as well as independent mobility  Home safety : The patient has smoke detectors in the home. They wear seatbelts.  There are no firearms at home. There is no violence in the home.   There is no risks for hepatitis, STDs or HIV. There is no   history of blood transfusion. They have no travel history to infectious disease endemic areas of the world.  The patient has seen their dentist in the last six month. They have seen their eye doctor in the last year. They admit to slight hearing difficulty with regard to whispered voices and some television programs.  They have deferred audiologic testing in the last year.  They do not  have excessive sun exposure. Discussed the need for sun protection: hats, long sleeves and use of sunscreen if there is significant sun exposure.   Diet: the importance of a healthy diet is discussed. They do have a healthy diet.  The benefits of regular aerobic exercise were discussed. She walks 4 times per week ,  20 minutes.   Depression screen: there are no signs or vegative symptoms of depression- irritability, change in appetite, anhedonia, sadness/tearfullness.  Cognitive assessment: the patient manages all their financial and personal affairs and is actively engaged. They could relate day,date,year and events; recalled 2/3 objects at 3 minutes; performed clock-face test normally.  The following portions of the patient's history were reviewed and updated as appropriate: allergies, current medications, past family history, past  medical history,  past surgical history, past social history  and problem list.  Visual acuity was not assessed per patient preference since she has regular follow up with her ophthalmologist. Hearing and body mass index were assessed and reviewed.   During the course of the visit the patient was educated and counseled about appropriate screening and preventive services including : fall prevention , diabetes screening, nutrition counseling, colorectal cancer screening, and recommended immunizations.    CC: The primary encounter diagnosis was Other hyperlipidemia. Diagnoses of Prediabetes, Elevated blood pressure reading in office without diagnosis of hypertension, Hyperlipidemia with target LDL less than 160, and Encounter for preventive health examination were also pertinent to this visit.  1) prediabetes  discussed :  A1c of 6.1 reviewed diet.   2) HLD  improved with crestor. Tolerating medication   2) had  a fall on march 31 occurred while getting out of shower,  was not holding on to anything and lost balance.  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   She has a past surgical history that includes Abdominal hysterectomy; Bunionectomy (2002); Cleft palate repair; Cataract extraction w/PHACO (Left, 04/01/2015); and Cataract extraction w/PHACO (Right, 12/16/2020).   Her family history includes Cancer in her father; Diabetes in her brother; Glaucoma (  age of onset: 8) in her brother; Heart attack in her brother; Hypertension in her brother; Osteoporosis in her mother.She reports that she quit smoking about 60 years ago. Her smoking use included cigarettes. She has never used smokeless  tobacco. She reports that she does not drink alcohol and does not use drugs.  Outpatient Medications Prior to Visit  Medication Sig Dispense Refill   brimonidine (ALPHAGAN P) 0.1 % SOLN Place 1 drop into both eyes 2 (two) times daily.     Cholecalciferol (VITAMIN D3) 50 MCG (2000 UT) capsule Take 2,000 Units by mouth daily.     dorzolamide-timolol (COSOPT) 22.3-6.8 MG/ML ophthalmic solution Place 1 drop into both eyes 2 (two) times daily.      LUMIGAN 0.01 % SOLN Place 1 drop into both eyes at bedtime.      Multiple Vitamins-Minerals (EYE MULTIVITAMIN/LUTEIN) TABS Take 1 tablet by mouth daily.     rosuvastatin (CRESTOR) 5 MG tablet Take 1 tablet (5 mg total) by mouth daily. 90 tablet 1   vitamin B-12 (CYANOCOBALAMIN) 1000 MCG tablet Take 2,000 mcg by mouth daily.     No facility-administered medications prior to visit.    Review of Systems  Patient denies headache, fevers, malaise, unintentional weight loss, skin rash, eye pain, sinus congestion and sinus pain, sore throat, dysphagia,  hemoptysis , cough, dyspnea, wheezing, chest pain, palpitations, orthopnea, edema, abdominal pain, nausea, melena, diarrhea, constipation, flank pain, dysuria, hematuria, urinary  Frequency, nocturia, numbness, tingling, seizures,  Focal weakness, Loss of consciousness,  Tremor, insomnia, depression, anxiety, and suicidal ideation.     Objective:  BP 132/82   Pulse 77   Temp 97.7 F (36.5 C) (Oral)   Ht '5\' 4"'$  (1.626 m)   Wt 139 lb 12.8 oz (63.4 kg)   SpO2 96%   BMI 24.00 kg/m   Physical Exam   General appearance: alert, cooperative and appears stated age Ears: normal TM's and external ear canals both ears Throat: lips, mucosa, and tongue normal; teeth and gums normal Neck: no adenopathy, no carotid bruit, supple, symmetrical, trachea midline and thyroid not enlarged, symmetric, no tenderness/mass/nodules Back: symmetric, no curvature. ROM normal. No CVA tenderness. Lungs: clear to auscultation  bilaterally Heart: regular rate and rhythm, S1, S2 normal, no murmur, click, rub or gallop Abdomen: soft, non-tender; bowel sounds normal; no masses,  no organomegaly Pulses: 2+ and symmetric Skin: Skin color, texture, turgor normal. No rashes or lesions Lymph nodes: Cervical, supraclavicular, and axillary nodes normal.  Assessment & Plan:   Problem List Items Addressed This Visit     Elevated blood pressure reading in office without diagnosis of hypertension    She  has no prior history of hypertension. She  will check his blood pressure several times over the next 3-4 weeks and to submit readings for evaluation. Urine microalbumin to creatinine ratio done today is positive for early proteinuria , but CR has improved.   Lab Results  Component Value Date   MICROALBUR 2.9 (H) 07/08/2022   MICROALBUR 1.3 10/09/2020   Lab Results  Component Value Date   CREATININE 0.87 07/08/2022            Encounter for preventive health examination    age appropriate education and counseling updated, referrals for preventative services and immunizations addressed, dietary and smoking counseling addressed, most recent labs reviewed.  I have personally reviewed and have noted:   1) the patient's medical and social history 2) The pt's use of alcohol, tobacco, and illicit drugs 3)  The patient's current medications and supplements 4) Functional ability including ADL's, fall risk, home safety risk, hearing and visual impairment 5) Diet and physical activities 6) Evidence for depression or mood disorder 7) The patient's height, weight, and BMI have been recorded in the chart     I have made referrals, and provided counseling and education based on review of the above       Hyperlipidemia with target LDL less than 160    She is tolerating rosuvastatin with excellent reduction in LDL and liver enzymes are stable  Lab Results  Component Value Date   CHOL 132 07/08/2022   HDL 50.70 07/08/2022    LDLCALC 56 07/08/2022   LDLDIRECT 61.0 07/08/2022   TRIG 127.0 07/08/2022   CHOLHDL 3 07/08/2022        Prediabetes    Her  random glucose is not  elevated but her A1c suggests she is at risk for developing diabetes.  I recommend she follow a low glycemic index diet and particpate regularly in an aerobic  exercise activity.  We should check an A1c in 6 months.        Relevant Orders   Hemoglobin A1c   Other Visit Diagnoses     Other hyperlipidemia    -  Primary   Relevant Orders   Comprehensive metabolic panel       I am having Maricela Bo. Stander maintain her dorzolamide-timolol, Lumigan, Eye Multivitamin/Lutein, brimonidine, cyanocobalamin, Vitamin D3, and rosuvastatin.    I provided 40 minutes of  face-to-face time during this encounter reviewing patient's current problems and past surgeries,  recent labs and imaging studies, providing counseling on the above mentioned problems , and coordination  of care .   Follow-up: No follow-ups on file.   Crecencio Mc, MD

## 2022-07-18 ENCOUNTER — Encounter: Payer: Self-pay | Admitting: Internal Medicine

## 2022-08-09 DIAGNOSIS — H401122 Primary open-angle glaucoma, left eye, moderate stage: Secondary | ICD-10-CM | POA: Diagnosis not present

## 2022-08-09 DIAGNOSIS — H401113 Primary open-angle glaucoma, right eye, severe stage: Secondary | ICD-10-CM | POA: Diagnosis not present

## 2022-08-09 DIAGNOSIS — Z961 Presence of intraocular lens: Secondary | ICD-10-CM | POA: Diagnosis not present

## 2022-08-12 ENCOUNTER — Telehealth: Payer: Self-pay

## 2022-08-12 DIAGNOSIS — I10 Essential (primary) hypertension: Secondary | ICD-10-CM

## 2022-08-12 NOTE — Telephone Encounter (Signed)
List of bp readings have been placed in the yellow results folder.

## 2022-08-17 ENCOUNTER — Encounter: Payer: Self-pay | Admitting: Internal Medicine

## 2022-08-17 MED ORDER — AMLODIPINE BESYLATE 2.5 MG PO TABS: 2.5000 mg | ORAL_TABLET | Freq: Every day | ORAL | 1 refills | Status: DC

## 2022-08-17 NOTE — Assessment & Plan Note (Signed)
Confirmed with outside readings at [facility]:  145/71 to 172/74  pulse 82 to 83.

## 2022-08-17 NOTE — Addendum Note (Signed)
Addended by: Crecencio Mc on: 08/17/2022 12:45 PM   Modules accepted: Orders

## 2022-08-17 NOTE — Telephone Encounter (Signed)
Sent a mychart message to pt to let her know.

## 2022-08-23 ENCOUNTER — Encounter: Payer: Self-pay | Admitting: Internal Medicine

## 2022-08-25 ENCOUNTER — Ambulatory Visit: Payer: Medicare PPO | Admitting: Dermatology

## 2022-08-29 DIAGNOSIS — H90A31 Mixed conductive and sensorineural hearing loss, unilateral, right ear with restricted hearing on the contralateral side: Secondary | ICD-10-CM | POA: Diagnosis not present

## 2022-08-29 DIAGNOSIS — H6123 Impacted cerumen, bilateral: Secondary | ICD-10-CM | POA: Diagnosis not present

## 2022-09-09 ENCOUNTER — Encounter: Payer: Self-pay | Admitting: Internal Medicine

## 2022-09-26 DIAGNOSIS — H903 Sensorineural hearing loss, bilateral: Secondary | ICD-10-CM | POA: Diagnosis not present

## 2022-10-20 ENCOUNTER — Other Ambulatory Visit: Payer: Self-pay | Admitting: Internal Medicine

## 2022-10-20 DIAGNOSIS — E785 Hyperlipidemia, unspecified: Secondary | ICD-10-CM

## 2022-11-07 ENCOUNTER — Other Ambulatory Visit: Payer: Self-pay | Admitting: Internal Medicine

## 2022-11-16 ENCOUNTER — Telehealth: Payer: Self-pay | Admitting: Internal Medicine

## 2022-11-16 NOTE — Telephone Encounter (Signed)
Contacted Maria Christensen to schedule their annual wellness visit. Appointment made for 11/18/2022.  Thank you,  [facility] Support [facility] Direct dial  [phone]

## 2022-11-18 ENCOUNTER — Ambulatory Visit (INDEPENDENT_AMBULATORY_CARE_PROVIDER_SITE_OTHER): Payer: Medicare PPO

## 2022-11-18 VITALS — Ht 64.0 in | Wt 139.0 lb

## 2022-11-18 DIAGNOSIS — Z0000 Encounter for general adult medical examination without abnormal findings: Secondary | ICD-10-CM | POA: Diagnosis not present

## 2022-11-18 NOTE — Progress Notes (Addendum)
Subjective:   Maria Christensen is a 82 y.o. female who presents for Medicare Annual (Subsequent) preventive examination.  Review of Systems    No ROS.  Medicare Wellness Virtual Visit.  Visual/audio telehealth visit, UTA vital signs.   See social history for additional risk factors.   Cardiac Risk Factors include: advanced age (>30men, >77 women);hypertension     Objective:    Today's Vitals   11/18/22 0917  Weight: 139 lb (63 kg)  Height: 5\' 4"  (1.626 m)   Body mass index is 23.86 kg/m.     11/18/2022    9:28 AM 12/16/2020   12:27 PM 05/12/2017    9:18 AM 04/01/2015   10:06 AM  Advanced Directives  Does Patient Have a Medical Advance Directive? Yes Yes Yes Yes  Type of Estate agent of [place];Living will Healthcare Power of [place];Living will Living will;Healthcare Power of Attorney   Does patient want to make changes to medical advance directive? No - Patient declined No - Patient declined No - Patient declined   Copy of Healthcare Power of Attorney in Chart? No - copy requested No - copy requested Yes     Current Medications (verified) Outpatient Encounter Medications as of 11/18/2022  Medication Sig   amLODipine (NORVASC) 2.5 MG tablet TAKE ONE TABLET BY MOUTH EVERY DAY   brimonidine (ALPHAGAN P) 0.1 % SOLN Place 1 drop into both eyes 2 (two) times daily.   Cholecalciferol (VITAMIN D3) 50 MCG (2000 UT) capsule Take 2,000 Units by mouth daily.   dorzolamide-timolol (COSOPT) 22.3-6.8 MG/ML ophthalmic solution Place 1 drop into both eyes 2 (two) times daily.    LUMIGAN 0.01 % SOLN Place 1 drop into both eyes at bedtime.    Multiple Vitamins-Minerals (EYE MULTIVITAMIN/LUTEIN) TABS Take 1 tablet by mouth daily.   rosuvastatin (CRESTOR) 5 MG tablet TAKE ONE TABLET BY MOUTH EVERY DAY   vitamin B-12 (CYANOCOBALAMIN) 1000 MCG tablet Take 2,000 mcg by mouth daily.   No facility-administered encounter medications on file as of 11/18/2022.    Allergies  (verified) Patient has no known allergies.   History: Past Medical History:  Diagnosis Date   Cerebral hemorrhage 2005   Cleft palate    Glaucoma    managed b [place],  bilateral   Herpes zoster    Mitral valve prolapse    2005 no deficits   Open-angle glaucoma    MANAGED BY DR [facility]    Osteopenia    PONV (postoperative nausea and vomiting)    Wears hearing aid in right ear    Past Surgical History:  Procedure Laterality Date   ABDOMINAL HYSTERECTOMY     BUNIONECTOMY  2002   CATARACT EXTRACTION W/PHACO Left 04/01/2015   Procedure: CATARACT EXTRACTION PHACO AND INTRAOCULAR LENS PLACEMENT (IOC);  Surgeon: Lockie Mola, MD;  Location: [facility];  Service: Ophthalmology;  Laterality: Left;   CATARACT EXTRACTION W/PHACO Right 12/16/2020   Procedure: CATARACT EXTRACTION PHACO AND INTRAOCULAR LENS PLACEMENT (IOC) RIGHT KAHOOK DUAL BLADE GONIOTOMY IV ZOFRAN 5.11 00:56.2 9.1%;  Surgeon: Lockie Mola, MD;  Location: [facility];  Service: Ophthalmology;  Laterality: Right;   CLEFT PALATE REPAIR     Family History  Problem Relation Age of Onset   Osteoporosis Mother    Cancer Father        colon   Heart attack Brother    Diabetes Brother    Glaucoma Brother 75       small cell lung CA  Hypertension Brother    Social History   Socioeconomic History   Marital status: Widowed    Spouse name: Not on file   Number of children: Not on file   Years of education: Not on file   Highest education level: Not on file  Occupational History   Occupation: retired  Tobacco Use   Smoking status: Former    Types: Cigarettes    Quit date: 11/15/1961    Years since quitting: 61.0   Smokeless tobacco: Never  Vaping Use   Vaping Use: Never used  Substance and Sexual Activity   Alcohol use: No   Drug use: No   Sexual activity: Not on file  Other Topics Concern   Not on file  Social History Narrative   Lives alone   Social  Determinants of Health   Financial Resource Strain: Low Risk  (11/18/2022)   Overall Financial Resource Strain (CARDIA)    Difficulty of Paying Living Expenses: Not hard at all  Food Insecurity: No Food Insecurity (11/18/2022)   Hunger Vital Sign    Worried About Running Out of Food in the Last Year: Never true    Ran Out of Food in the Last Year: Never true  Transportation Needs: No Transportation Needs (11/18/2022)   PRAPARE - Administrator, Civil Service (Medical): No    Lack of Transportation (Non-Medical): No  Physical Activity: Insufficiently Active (11/18/2022)   Exercise Vital Sign    Days of Exercise per Week: 7 days    Minutes of Exercise per Session: 10 min  Stress: No Stress Concern Present (11/18/2022)   [employer] of Occupational Health - Occupational Stress Questionnaire    Feeling of Stress : Only a little  Social Connections: Unknown (11/18/2022)   Social Connection and Isolation Panel [NHANES]    Frequency of Communication with Friends and Family: More than three times a week    Frequency of Social Gatherings with Friends and Family: More than three times a week    Attends Religious Services: Not on Marketing executive or Organizations: Not on file    Attends Banker Meetings: Not on file    Marital Status: Not on file    Tobacco Counseling Counseling given: Not Answered   Clinical Intake:  Pre-visit preparation completed: Yes        Diabetes: No  How often do you need to have someone help you when you read instructions, pamphlets, or other written materials from your doctor or pharmacy?: 1 - Never    Interpreter Needed?: No      Activities of Daily Living    11/18/2022    9:30 AM  In your present state of health, do you have any difficulty performing the following activities:  Hearing? 1  Comment Hearing aids  Vision? 0  Difficulty concentrating or making decisions? 0  Walking or climbing stairs? 0   Dressing or bathing? 0  Doing errands, shopping? 0  Preparing Food and eating ? N  Using the Toilet? N  In the past six months, have you accidently leaked urine? N  Do you have problems with loss of bowel control? N  Managing your Medications? N  Managing your Finances? N  Housekeeping or managing your Housekeeping? N    Patient Care Team: Sherlene Shams, MD as PCP - General (Internal Medicine)  Indicate any recent Medical Services you may have received from other than [facility] providers in the past year (date may  be approximate).     Assessment:   This is a routine wellness examination for [facility].  I connected with  Maria Christensen on 11/18/22 by a audio enabled telemedicine application and verified that I am speaking with the correct person using two identifiers.  Patient Location: Home  Provider Location: Office/Clinic  I discussed the limitations of evaluation and management by telemedicine. The patient expressed understanding and agreed to proceed.   Hearing/Vision screen Hearing Screening - Comments:: Hearing aids Vision Screening - Comments:: [facility] Wears corrective lenses Visits every 6 months Glaucoma; drops in use Cataract extraction, bilateral Visual acuity not assessed per patient preference since they have regular follow up with the ophthalmologist    Dietary issues and exercise activities discussed: Current Exercise Habits: Home exercise routine, Type of exercise: walking, Time (Minutes): 10, Frequency (Times/Week): 7, Weekly Exercise (Minutes/Week): 70, Intensity: Mild Regular diet Good water intake   Goals Addressed               This Visit's Progress     Patient Stated     Increase physical activity (pt-stated)        Walk more for exercise        Depression Screen    11/18/2022    9:25 AM 07/13/2022    1:38 PM 07/12/2021    9:36 AM 07/09/2020    8:37 AM 07/02/2019    1:04 PM 05/12/2017    9:21 AM 11/16/2016     8:35 AM  PHQ 2/9 Scores  PHQ - 2 Score 0 0 0 0 0 0 0  PHQ- 9 Score     0 0     Fall Risk    11/18/2022    9:31 AM 07/13/2022    1:38 PM 07/12/2021    9:36 AM 07/09/2020    8:37 AM 07/02/2019    1:04 PM  Fall Risk   Falls in the past year? 0 1 0 0 0  Number falls in past yr: 0 0     Injury with Fall? 0 1     Risk for fall due to :  History of fall(s) No Fall Risks    Follow up Falls evaluation completed;Falls prevention discussed Falls evaluation completed Falls evaluation completed Falls evaluation completed Falls evaluation completed    FALL RISK PREVENTION PERTAINING TO THE HOME: Home free of loose throw rugs in walkways, pet beds, electrical cords, etc? Yes  Adequate lighting in your home to reduce risk of falls? Yes   ASSISTIVE DEVICES UTILIZED TO PREVENT FALLS: Life alert? Yes  Use of a cane, walker or w/c? No  Grab bars in the bathroom? Yes  Shower chair or bench in shower? No  Elevated toilet seat or a handicapped toilet? No   TIMED UP AND GO: Was the test performed? No .   Cognitive Function:    05/12/2017    9:52 AM  MMSE - Mini Mental State Exam  Orientation to time 5  Orientation to Place 5  Registration 3  Attention/ Calculation 5  Recall 3  Language- name 2 objects 2  Language- repeat 1  Language- follow 3 step command 3  Language- read & follow direction 1  Write a sentence 1  Copy design 1  Total score 30        11/18/2022    9:33 AM  6CIT Screen  What Year? 0 points  What month? 0 points  What time? 0 points  Count back from 20  0 points  Months in reverse 0 points  Repeat phrase 0 points  Total Score 0 points    Immunizations Immunization History  Administered Date(s) Administered   Fluad Quad(high Dose 65+) 04/05/2019, 04/15/2020   Influenza Split 05/17/2012   Influenza Whole 05/17/2012   Influenza, High Dose Seasonal PF 05/26/2015, 05/12/2016, 05/03/2017, 04/20/2021, 05/18/2022   Influenza,inj,Quad PF,6+ Mos 05/28/2013, 05/03/2014    Influenza-Unspecified 06/03/2018   Moderna Covid-19 Vaccine Bivalent Booster 59yrs & up 05/18/2022   PFIZER(Purple Top)SARS-COV-2 Vaccination 08/20/2019, 09/09/2019, 05/04/2020, 01/15/2021   PNEUMOCOCCAL CONJUGATE-20 07/12/2021   Pfizer Covid-19 Vaccine Bivalent Booster 50yrs & up 05/04/2021   Pneumococcal Conjugate-13 10/29/2014   Pneumococcal Polysaccharide-23 04/28/2010   Respiratory Syncytial Virus Vaccine,Recomb Aduvanted(Arexvy) 05/05/2022   Td 02/14/2009   Tdap 03/04/2009, 07/28/2020, 01/28/2021   Zoster Recombinat (Shingrix) 12/05/2016, 03/15/2017   Zoster, Live 06/12/2006   Covid-19 vaccine status: Completed vaccines  Screening Tests Health Maintenance  Topic Date Due   COVID-19 Vaccine (7 - 2023-24 season) 12/04/2022 (Originally 07/13/2022)   INFLUENZA VACCINE  03/09/2023   Medicare Annual Wellness (AWV)  11/18/2023   DTaP/Tdap/Td (5 - Td or Tdap) 01/29/2031   Pneumonia Vaccine 25+ Years old  Completed   DEXA SCAN  Completed   Zoster Vaccines- Shingrix  Completed   HPV VACCINES  Aged Out    Health Maintenance There are no preventive care reminders to display for this patient.  Lung Cancer Screening: (Low Dose CT Chest recommended if Age 43-80 years, 30 pack-year currently smoking OR have quit w/in 15years.) does not qualify.   Hepatitis C Screening: does not qualify  Vision Screening: Recommended annual ophthalmology exams for early detection of glaucoma and other disorders of the eye.  Dental Screening: Recommended annual dental exams for proper oral hygiene  Community Resource Referral / Chronic Care Management: CRR required this visit?  No   CCM required this visit?  No      Plan:     I have personally reviewed and noted the following in the patient's chart:   Medical and social history Use of alcohol, tobacco or illicit drugs  Current medications and supplements including opioid prescriptions. Patient is not currently taking opioid  prescriptions. Functional ability and status Nutritional status Physical activity Advanced directives List of other physicians Hospitalizations, surgeries, and ER visits in previous 12 months Vitals Screenings to include cognitive, depression, and falls Referrals and appointments  In addition, I have reviewed and discussed with patient certain preventive protocols, quality metrics, and best practice recommendations. A written personalized care plan for preventive services as well as general preventive health recommendations were provided to patient.     Ilah Boule L Motley, LPN   1/61/0960     I have reviewed the above information and agree with above.   Duncan Dull, MD

## 2022-11-18 NOTE — Patient Instructions (Addendum)
Maria Christensen , Thank you for taking time to come for your Medicare Wellness Visit. I appreciate your ongoing commitment to your health goals. Please review the following plan we discussed and let me know if I can assist you in the future.   These are the goals we discussed:  Goals       Patient Stated     Increase physical activity (pt-stated)      Walk more for exercise       Other     Increase water intake      Stay hydrated        This is a list of the screening recommended for you and due dates:  Health Maintenance  Topic Date Due   COVID-19 Vaccine (7 - 2023-24 season) 12/04/2022*   Flu Shot  03/09/2023   Medicare Annual Wellness Visit  11/18/2023   DTaP/Tdap/Td vaccine (5 - Td or Tdap) 01/29/2031   Pneumonia Vaccine  Completed   DEXA scan (bone density measurement)  Completed   Zoster (Shingles) Vaccine  Completed   HPV Vaccine  Aged Out  *Topic was postponed. The date shown is not the original due date.    Advanced directives: End of life planning; Advance aging; Advanced directives discussed.  Copy of current HCPOA/Living Will requested.    Conditions/risks identified: none new  Next appointment: Follow up in one year for your annual wellness visit    Preventive Care 65 Years and Older, Female Preventive care refers to lifestyle choices and visits with your health care provider that can promote health and wellness. What does preventive care include? A yearly physical exam. This is also called an annual well check. Dental exams once or twice a year. Routine eye exams. Ask your health care provider how often you should have your eyes checked. Personal lifestyle choices, including: Daily care of your teeth and gums. Regular physical activity. Eating a healthy diet. Avoiding tobacco and drug use. Limiting alcohol use. Practicing safe sex. Taking low-dose aspirin every day. Taking vitamin and mineral supplements as recommended by your health care  provider. What happens during an annual well check? The services and screenings done by your health care provider during your annual well check will depend on your age, overall health, lifestyle risk factors, and family history of disease. Counseling  Your health care provider may ask you questions about your: Alcohol use. Tobacco use. Drug use. Emotional well-being. Home and relationship well-being. Sexual activity. Eating habits. History of falls. Memory and ability to understand (cognition). Work and work Astronomer. Reproductive health. Screening  You may have the following tests or measurements: Height, weight, and BMI. Blood pressure. Lipid and cholesterol levels. These may be checked every 5 years, or more frequently if you are over 49 years old. Skin check. Lung cancer screening. You may have this screening every year starting at age 49 if you have a 30-pack-year history of smoking and currently smoke or have quit within the past 15 years. Fecal occult blood test (FOBT) of the stool. You may have this test every year starting at age 57. Flexible sigmoidoscopy or colonoscopy. You may have a sigmoidoscopy every 5 years or a colonoscopy every 10 years starting at age 35. Hepatitis C blood test. Hepatitis B blood test. Sexually transmitted disease (STD) testing. Diabetes screening. This is done by checking your blood sugar (glucose) after you have not eaten for a while (fasting). You may have this done every 1-3 years. Bone density scan. This is done to screen  for osteoporosis. You may have this done starting at age 49. Mammogram. This may be done every 1-2 years. Talk to your health care provider about how often you should have regular mammograms. Talk with your health care provider about your test results, treatment options, and if necessary, the need for more tests. Vaccines  Your health care provider may recommend certain vaccines, such as: Influenza vaccine. This is  recommended every year. Tetanus, diphtheria, and acellular pertussis (Tdap, Td) vaccine. You may need a Td booster every 10 years. Zoster vaccine. You may need this after age 33. Pneumococcal 13-valent conjugate (PCV13) vaccine. One dose is recommended after age 46. Pneumococcal polysaccharide (PPSV23) vaccine. One dose is recommended after age 75. Talk to your health care provider about which screenings and vaccines you need and how often you need them. This information is not intended to replace advice given to you by your health care provider. Make sure you discuss any questions you have with your health care provider. Document Released: 08/21/2015 Document Revised: 04/13/2016 Document Reviewed: 05/26/2015 Elsevier Interactive Patient Education  2017 [place] Prevention in the Home Falls can cause injuries. They can happen to people of all ages. There are many things you can do to make your home safe and to help prevent falls. What can I do on the outside of my home? Regularly fix the edges of walkways and driveways and fix any cracks. Remove anything that might make you trip as you walk through a door, such as a raised step or threshold. Trim any bushes or trees on the path to your home. Use bright outdoor lighting. Clear any walking paths of anything that might make someone trip, such as rocks or tools. Regularly check to see if handrails are loose or broken. Make sure that both sides of any steps have handrails. Any raised decks and porches should have guardrails on the edges. Have any leaves, snow, or ice cleared regularly. Use sand or salt on walking paths during winter. Clean up any spills in your garage right away. This includes oil or grease spills. What can I do in the bathroom? Use night lights. Install grab bars by the toilet and in the tub and shower. Do not use towel bars as grab bars. Use non-skid mats or decals in the tub or shower. If you need to sit down in  the shower, use a plastic, non-slip stool. Keep the floor dry. Clean up any water that spills on the floor as soon as it happens. Remove soap buildup in the tub or shower regularly. Attach bath mats securely with double-sided non-slip rug tape. Do not have throw rugs and other things on the floor that can make you trip. What can I do in the bedroom? Use night lights. Make sure that you have a light by your bed that is easy to reach. Do not use any sheets or blankets that are too big for your bed. They should not hang down onto the floor. Have a firm chair that has side arms. You can use this for support while you get dressed. Do not have throw rugs and other things on the floor that can make you trip. What can I do in the kitchen? Clean up any spills right away. Avoid walking on wet floors. Keep items that you use a lot in easy-to-reach places. If you need to reach something above you, use a strong step stool that has a grab bar. Keep electrical cords out of the way. Do  not use floor polish or wax that makes floors slippery. If you must use wax, use non-skid floor wax. Do not have throw rugs and other things on the floor that can make you trip. What can I do with my stairs? Do not leave any items on the stairs. Make sure that there are handrails on both sides of the stairs and use them. Fix handrails that are broken or loose. Make sure that handrails are as long as the stairways. Check any carpeting to make sure that it is firmly attached to the stairs. Fix any carpet that is loose or worn. Avoid having throw rugs at the top or bottom of the stairs. If you do have throw rugs, attach them to the floor with carpet tape. Make sure that you have a light switch at the top of the stairs and the bottom of the stairs. If you do not have them, ask someone to add them for you. What else can I do to help prevent falls? Wear shoes that: Do not have high heels. Have rubber bottoms. Are comfortable  and fit you well. Are closed at the toe. Do not wear sandals. If you use a stepladder: Make sure that it is fully opened. Do not climb a closed stepladder. Make sure that both sides of the stepladder are locked into place. Ask someone to hold it for you, if possible. Clearly mark and make sure that you can see: Any grab bars or handrails. First and last steps. Where the edge of each step is. Use tools that help you move around (mobility aids) if they are needed. These include: Canes. Walkers. Scooters. Crutches. Turn on the lights when you go into a dark area. Replace any light bulbs as soon as they burn out. Set up your furniture so you have a clear path. Avoid moving your furniture around. If any of your floors are uneven, fix them. If there are any pets around you, be aware of where they are. Review your medicines with your doctor. Some medicines can make you feel dizzy. This can increase your chance of falling. Ask your doctor what other things that you can do to help prevent falls. This information is not intended to replace advice given to you by your health care provider. Make sure you discuss any questions you have with your health care provider. Document Released: 05/21/2009 Document Revised: 12/31/2015 Document Reviewed: 08/29/2014 Elsevier Interactive Patient Education  2017 [employer].

## 2022-12-28 ENCOUNTER — Encounter: Payer: Self-pay | Admitting: Internal Medicine

## 2023-01-09 ENCOUNTER — Other Ambulatory Visit (INDEPENDENT_AMBULATORY_CARE_PROVIDER_SITE_OTHER): Payer: Medicare PPO

## 2023-01-09 DIAGNOSIS — E7849 Other hyperlipidemia: Secondary | ICD-10-CM

## 2023-01-09 DIAGNOSIS — R7303 Prediabetes: Secondary | ICD-10-CM

## 2023-01-09 LAB — COMPREHENSIVE METABOLIC PANEL
ALT: 8 U/L (ref 0–35)
AST: 13 U/L (ref 0–37)
Albumin: 3.9 g/dL (ref 3.5–5.2)
Alkaline Phosphatase: 54 U/L (ref 39–117)
BUN: 16 mg/dL (ref 6–23)
CO2: 23 mEq/L (ref 19–32)
Calcium: 9.3 mg/dL (ref 8.4–10.5)
Chloride: 109 mEq/L (ref 96–112)
Creatinine, Ser: 1.04 mg/dL (ref 0.40–1.20)
GFR: 49.93 mL/min — ABNORMAL LOW (ref >60.00)
Glucose, Bld: 160 mg/dL — ABNORMAL HIGH (ref 70–99)
Potassium: 4.4 mEq/L (ref 3.5–5.1)
Sodium: 142 mEq/L (ref 135–145)
Total Bilirubin: 1.3 mg/dL — ABNORMAL HIGH (ref 0.2–1.2)
Total Protein: 6.1 g/dL (ref 6.0–8.3)

## 2023-01-09 LAB — HEMOGLOBIN A1C: Hgb A1c MFr Bld: 5.9 % (ref 4.6–6.5)

## 2023-01-12 ENCOUNTER — Encounter: Payer: Self-pay | Admitting: Internal Medicine

## 2023-01-12 ENCOUNTER — Ambulatory Visit: Payer: Medicare PPO | Admitting: Internal Medicine

## 2023-01-12 VITALS — BP 120/74 | HR 69 | Temp 97.7°F | Ht 64.0 in | Wt 140.8 lb

## 2023-01-12 DIAGNOSIS — I10 Essential (primary) hypertension: Secondary | ICD-10-CM | POA: Diagnosis not present

## 2023-01-12 DIAGNOSIS — E559 Vitamin D deficiency, unspecified: Secondary | ICD-10-CM

## 2023-01-12 DIAGNOSIS — N1831 Chronic kidney disease, stage 3a: Secondary | ICD-10-CM

## 2023-01-12 DIAGNOSIS — E785 Hyperlipidemia, unspecified: Secondary | ICD-10-CM | POA: Diagnosis not present

## 2023-01-12 DIAGNOSIS — R351 Nocturia: Secondary | ICD-10-CM | POA: Insufficient documentation

## 2023-01-12 DIAGNOSIS — E538 Deficiency of other specified B group vitamins: Secondary | ICD-10-CM | POA: Diagnosis not present

## 2023-01-12 DIAGNOSIS — Z789 Other specified health status: Secondary | ICD-10-CM | POA: Diagnosis not present

## 2023-01-12 DIAGNOSIS — R7303 Prediabetes: Secondary | ICD-10-CM | POA: Diagnosis not present

## 2023-01-12 NOTE — Progress Notes (Signed)
Subjective:  Patient ID: Maria Christensen, female    DOB: [birth year]  Age: 82 y.o. MRN: [id number]  CC: The primary encounter diagnosis was Essential hypertension. Diagnoses of Hyperlipidemia with target LDL less than 160, Prediabetes, B12 deficiency, Vitamin D deficiency, Active advance directive on file, Stage 3a chronic kidney disease (HCC), and Nocturia more than twice per night were also pertinent to this visit.   HPI Maria Christensen presents for follow up on hypertension and other  issues    1)  prediabetes:  Her  random glucose was mildly  elevated but she had eaten bran cereal one hour prior to labs.  her A1c suggests she is at risk for developing diabetes.  We discussed the change in diet and weight gain she has experienced since moving to village of [place];  she is eating more desserts .  I recommend she follow a low glycemic index diet and particpate regularly in an aerobic  exercise activity.     2) nocturia 3-4 times per night for the past month.  No dysuria.  Has been drinking more water in the evening .  Does not have frequency during the day   3) HTN:  tolerating amlodipine  without edema .  Home machine is inaccurate and elevating her readings  by 30-40 pts.    4) stage 3 CKD :  re reviewed her labs.  Seh is avoiding nsaids   4) End of life discussion DNR and MOST formes reviewed   Outpatient Medications Prior to Visit  Medication Sig Dispense Refill   amLODipine (NORVASC) 2.5 MG tablet TAKE ONE TABLET BY MOUTH EVERY DAY 90 tablet 1   brimonidine (ALPHAGAN P) 0.1 % SOLN Place 1 drop into both eyes 2 (two) times daily.     Cholecalciferol (VITAMIN D3) 50 MCG (2000 UT) capsule Take 2,000 Units by mouth daily.     dorzolamide-timolol (COSOPT) 22.3-6.8 MG/ML ophthalmic solution Place 1 drop into both eyes 2 (two) times daily.      LUMIGAN 0.01 % SOLN Place 1 drop into both eyes at bedtime.      Multiple Vitamins-Minerals (EYE MULTIVITAMIN/LUTEIN) TABS Take 1 tablet by  mouth daily.     rosuvastatin (CRESTOR) 5 MG tablet TAKE ONE TABLET BY MOUTH EVERY DAY 90 tablet 3   vitamin B-12 (CYANOCOBALAMIN) 1000 MCG tablet Take 2,000 mcg by mouth daily.     No facility-administered medications prior to visit.    Review of Systems;  Patient denies headache, fevers, malaise, unintentional weight loss, skin rash, eye pain, sinus congestion and sinus pain, sore throat, dysphagia,  hemoptysis , cough, dyspnea, wheezing, chest pain, palpitations, orthopnea, edema, abdominal pain, nausea, melena, diarrhea, constipation, flank pain, dysuria, hematuria, urinary  Frequency,  numbness, tingling, seizures,  Focal weakness, Loss of consciousness,  Tremor, insomnia, depression, anxiety, and suicidal ideation.      Objective:  BP 120/74   Pulse 69   Temp 97.7 F (36.5 C)   Ht 5\' 4"  (1.626 m)   Wt 140 lb 12.8 oz (63.9 kg)   SpO2 99%   BMI 24.17 kg/m   BP Readings from Last 3 Encounters:  01/12/23 120/74  07/13/22 132/82  07/12/21 128/70    Wt Readings from Last 3 Encounters:  01/12/23 140 lb 12.8 oz (63.9 kg)  11/18/22 139 lb (63 kg)  07/13/22 139 lb 12.8 oz (63.4 kg)    Physical Exam Vitals reviewed.  Constitutional:      General: She is not in acute distress.  Appearance: Normal appearance. She is normal weight. She is not ill-appearing, toxic-appearing or diaphoretic.  HENT:     Head: Normocephalic.  Eyes:     General: No scleral icterus.       Right eye: No discharge.        Left eye: No discharge.     Conjunctiva/sclera: Conjunctivae normal.  Cardiovascular:     Rate and Rhythm: Normal rate and regular rhythm.     Heart sounds: Normal heart sounds.  Pulmonary:     Effort: Pulmonary effort is normal. No respiratory distress.     Breath sounds: Normal breath sounds.  Musculoskeletal:        General: Normal range of motion.  Skin:    General: Skin is warm and dry.  Neurological:     General: No focal deficit present.     Mental Status: She is  alert and oriented to person, place, and time. Mental status is at baseline.  Psychiatric:        Mood and Affect: Mood normal.        Behavior: Behavior normal.        Thought Content: Thought content normal.        Judgment: Judgment normal.     Lab Results  Component Value Date   HGBA1C 5.9 01/09/2023   HGBA1C 6.1 07/08/2022   HGBA1C 5.6 07/02/2019    Lab Results  Component Value Date   CREATININE 1.04 01/09/2023   CREATININE 0.87 07/08/2022   CREATININE 1.00 02/07/2022    Lab Results  Component Value Date   WBC 5.7 07/08/2022   HGB 14.8 07/08/2022   HCT 43.0 07/08/2022   PLT 176.0 07/08/2022   GLUCOSE 160 (H) 01/09/2023   CHOL 132 07/08/2022   TRIG 127.0 07/08/2022   HDL 50.70 07/08/2022   LDLDIRECT 61.0 07/08/2022   LDLCALC 56 07/08/2022   ALT 8 01/09/2023   AST 13 01/09/2023   NA 142 01/09/2023   K 4.4 01/09/2023   CL 109 01/09/2023   CREATININE 1.04 01/09/2023   BUN 16 01/09/2023   CO2 23 01/09/2023   TSH 1.69 07/08/2022   HGBA1C 5.9 01/09/2023   MICROALBUR 2.9 (H) 07/08/2022    No results found.  Assessment & Plan:  .Essential hypertension Assessment & Plan: Well controlled on LOW DOSE AMLODIPINE . Renal function stable, no changes today.   Orders: -     Comprehensive metabolic panel; Future  Hyperlipidemia with target LDL less than 160 Assessment & Plan: CURRENT NONFASTING LABS REVIEWED   Lab Results  Component Value Date   CHOL 132 07/08/2022   HDL 50.70 07/08/2022   LDLCALC 56 07/08/2022   LDLDIRECT 61.0 07/08/2022   TRIG 127.0 07/08/2022   CHOLHDL 3 07/08/2022     Orders: -     Lipid panel; Future  Prediabetes Assessment & Plan: NONFASTING LABS WERE DONE IN JUNE ,  A1C 5.9    Orders: -     Hemoglobin A1c; Future  B12 deficiency -     Vitamin B12; Future -     CBC with Differential/Platelet; Future  Vitamin D deficiency -     VITAMIN D 25 Hydroxy (Vit-D Deficiency, Fractures); Future  Active advance directive on  file Assessment & Plan: MOST FORM REVIEWED AND SIGNED.  NO CHANGES   Stage 3a chronic kidney disease (HCC) Assessment & Plan: REVIEWED THE LAST 3 YEARS OF LABS.  GFR IS RELATIVELY STABLE AND BP IS WELL CONTROLLED ON MINIMAL DOSE OF AMLODIPINE   Nocturia  more than twice per night Assessment & Plan: Advised to reduce her nighttime fluid intake       Follow-up: Return in about 6 months (around 07/14/2023) for physical.   Sherlene Shams, MD

## 2023-01-12 NOTE — Assessment & Plan Note (Signed)
MOST FORM REVIEWED AND SIGNED.  NO CHANGES

## 2023-01-12 NOTE — Assessment & Plan Note (Signed)
Advised to reduce her nighttime fluid intake

## 2023-01-12 NOTE — Patient Instructions (Addendum)
We will fax the results of today's visit to [facility] Nurse  [phone]  You will return in December for your annual  physical  I will order the fasting labs  so you can  them done a day or two prior to your visit  (3-4 hours is sufficient to fast)   Kidney function is stable and Stage 3   Blood pressure machine (home machine) is OVERESTIMATING your readings so stop using it.    Let the [facility] nurse check your BP once a month

## 2023-01-12 NOTE — Assessment & Plan Note (Signed)
Well controlled on LOW DOSE AMLODIPINE . Renal function stable, no changes today.

## 2023-01-12 NOTE — Assessment & Plan Note (Signed)
REVIEWED THE LAST 3 YEARS OF LABS.  GFR IS RELATIVELY STABLE AND BP IS WELL CONTROLLED ON MINIMAL DOSE OF AMLODIPINE

## 2023-01-12 NOTE — Assessment & Plan Note (Signed)
NONFASTING LABS WERE DONE IN JUNE ,  A1C 5.9

## 2023-01-12 NOTE — Assessment & Plan Note (Signed)
CURRENT NONFASTING LABS REVIEWED   Lab Results  Component Value Date   CHOL 132 07/08/2022   HDL 50.70 07/08/2022   LDLCALC 56 07/08/2022   LDLDIRECT 61.0 07/08/2022   TRIG 127.0 07/08/2022   CHOLHDL 3 07/08/2022

## 2023-01-26 ENCOUNTER — Encounter: Payer: Self-pay | Admitting: Internal Medicine

## 2023-01-31 ENCOUNTER — Encounter: Payer: Self-pay | Admitting: Internal Medicine

## 2023-01-31 MED ORDER — AMLODIPINE BESYLATE 2.5 MG PO TABS: 2.5000 mg | ORAL_TABLET | Freq: Every day | ORAL | 1 refills | Status: DC

## 2023-02-10 DIAGNOSIS — H401122 Primary open-angle glaucoma, left eye, moderate stage: Secondary | ICD-10-CM | POA: Diagnosis not present

## 2023-02-10 DIAGNOSIS — Z961 Presence of intraocular lens: Secondary | ICD-10-CM | POA: Diagnosis not present

## 2023-02-10 DIAGNOSIS — H401113 Primary open-angle glaucoma, right eye, severe stage: Secondary | ICD-10-CM | POA: Diagnosis not present

## 2023-02-24 DIAGNOSIS — L720 Epidermal cyst: Secondary | ICD-10-CM | POA: Diagnosis not present

## 2023-02-24 DIAGNOSIS — L821 Other seborrheic keratosis: Secondary | ICD-10-CM | POA: Diagnosis not present

## 2023-02-24 DIAGNOSIS — L814 Other melanin hyperpigmentation: Secondary | ICD-10-CM | POA: Diagnosis not present

## 2023-03-23 DIAGNOSIS — H90A31 Mixed conductive and sensorineural hearing loss, unilateral, right ear with restricted hearing on the contralateral side: Secondary | ICD-10-CM | POA: Diagnosis not present

## 2023-03-23 DIAGNOSIS — H6123 Impacted cerumen, bilateral: Secondary | ICD-10-CM | POA: Diagnosis not present

## 2023-03-29 ENCOUNTER — Encounter: Payer: Self-pay | Admitting: Internal Medicine

## 2023-04-03 DIAGNOSIS — H903 Sensorineural hearing loss, bilateral: Secondary | ICD-10-CM | POA: Diagnosis not present

## 2023-04-04 ENCOUNTER — Encounter: Payer: Self-pay | Admitting: Internal Medicine

## 2023-04-06 ENCOUNTER — Encounter: Payer: Self-pay | Admitting: Internal Medicine

## 2023-05-10 ENCOUNTER — Encounter: Payer: Self-pay | Admitting: Internal Medicine

## 2023-06-09 ENCOUNTER — Encounter: Payer: Self-pay | Admitting: Internal Medicine

## 2023-06-09 DIAGNOSIS — I10 Essential (primary) hypertension: Secondary | ICD-10-CM

## 2023-06-29 MED ORDER — AMLODIPINE BESYLATE 5 MG PO TABS: 5.0000 mg | ORAL_TABLET | Freq: Every day | ORAL | 0 refills | Status: DC

## 2023-06-29 NOTE — Assessment & Plan Note (Signed)
Home readings have been elevated on 2.5 mg amlodipine.  Advised to increase to 5 mg daily

## 2023-07-17 ENCOUNTER — Other Ambulatory Visit (INDEPENDENT_AMBULATORY_CARE_PROVIDER_SITE_OTHER): Payer: Medicare PPO

## 2023-07-17 DIAGNOSIS — R7303 Prediabetes: Secondary | ICD-10-CM | POA: Diagnosis not present

## 2023-07-17 DIAGNOSIS — E559 Vitamin D deficiency, unspecified: Secondary | ICD-10-CM

## 2023-07-17 DIAGNOSIS — E785 Hyperlipidemia, unspecified: Secondary | ICD-10-CM | POA: Diagnosis not present

## 2023-07-17 DIAGNOSIS — I10 Essential (primary) hypertension: Secondary | ICD-10-CM

## 2023-07-17 DIAGNOSIS — E538 Deficiency of other specified B group vitamins: Secondary | ICD-10-CM

## 2023-07-17 LAB — LIPID PANEL
Cholesterol: 135 mg/dL (ref 0–200)
HDL: 48.2 mg/dL (ref >39.00)
LDL Cholesterol: 63 mg/dL (ref 0–99)
NonHDL: 86.66
Total CHOL/HDL Ratio: 3
Triglycerides: 117 mg/dL (ref 0.0–149.0)
VLDL: 23.4 mg/dL (ref 0.0–40.0)

## 2023-07-17 LAB — VITAMIN B12: Vitamin B-12: >1537 pg/mL — ABNORMAL HIGH (ref 211–911)

## 2023-07-17 LAB — VITAMIN D 25 HYDROXY (VIT D DEFICIENCY, FRACTURES): VITD: 49.71 ng/mL (ref 30.00–100.00)

## 2023-07-17 LAB — CBC WITH DIFFERENTIAL/PLATELET
Basophils Absolute: 0 10*3/uL (ref 0.0–0.1)
Basophils Relative: 0.5 % (ref 0.0–3.0)
Eosinophils Absolute: 0.1 10*3/uL (ref 0.0–0.7)
Eosinophils Relative: 1.7 % (ref 0.0–5.0)
HCT: 46.5 % — ABNORMAL HIGH (ref 36.0–46.0)
Hemoglobin: 15.4 g/dL — ABNORMAL HIGH (ref 12.0–15.0)
Lymphocytes Relative: 21.9 % (ref 12.0–46.0)
Lymphs Abs: 1.5 10*3/uL (ref 0.7–4.0)
MCHC: 33.2 g/dL (ref 30.0–36.0)
MCV: 92 fL (ref 78.0–100.0)
Monocytes Absolute: 0.6 10*3/uL (ref 0.1–1.0)
Monocytes Relative: 9 % (ref 3.0–12.0)
Neutro Abs: 4.7 10*3/uL (ref 1.4–7.7)
Neutrophils Relative %: 66.9 % (ref 43.0–77.0)
Platelets: 204 10*3/uL (ref 150.0–400.0)
RBC: 5.05 Mil/uL (ref 3.87–5.11)
RDW: 13.7 % (ref 11.5–15.5)
WBC: 7 10*3/uL (ref 4.0–10.5)

## 2023-07-17 LAB — COMPREHENSIVE METABOLIC PANEL
ALT: 10 U/L (ref 0–35)
AST: 13 U/L (ref 0–37)
Albumin: 4.1 g/dL (ref 3.5–5.2)
Alkaline Phosphatase: 62 U/L (ref 39–117)
BUN: 15 mg/dL (ref 6–23)
CO2: 23 meq/L (ref 19–32)
Calcium: 9.1 mg/dL (ref 8.4–10.5)
Chloride: 108 meq/L (ref 96–112)
Creatinine, Ser: 1.12 mg/dL (ref 0.40–1.20)
GFR: 45.51 mL/min — ABNORMAL LOW (ref >60.00)
Glucose, Bld: 124 mg/dL — ABNORMAL HIGH (ref 70–99)
Potassium: 4.6 meq/L (ref 3.5–5.1)
Sodium: 139 meq/L (ref 135–145)
Total Bilirubin: 2.1 mg/dL — ABNORMAL HIGH (ref 0.2–1.2)
Total Protein: 6.5 g/dL (ref 6.0–8.3)

## 2023-07-17 LAB — HEMOGLOBIN A1C: Hgb A1c MFr Bld: 6.2 % (ref 4.6–6.5)

## 2023-07-20 ENCOUNTER — Ambulatory Visit (INDEPENDENT_AMBULATORY_CARE_PROVIDER_SITE_OTHER): Payer: Medicare PPO | Admitting: Internal Medicine

## 2023-07-20 ENCOUNTER — Encounter: Payer: Self-pay | Admitting: Internal Medicine

## 2023-07-20 VITALS — BP 132/70 | HR 82 | Ht 64.0 in | Wt 144.8 lb

## 2023-07-20 DIAGNOSIS — E785 Hyperlipidemia, unspecified: Secondary | ICD-10-CM | POA: Diagnosis not present

## 2023-07-20 DIAGNOSIS — Z9181 History of falling: Secondary | ICD-10-CM | POA: Diagnosis not present

## 2023-07-20 DIAGNOSIS — E538 Deficiency of other specified B group vitamins: Secondary | ICD-10-CM | POA: Diagnosis not present

## 2023-07-20 DIAGNOSIS — R7303 Prediabetes: Secondary | ICD-10-CM | POA: Diagnosis not present

## 2023-07-20 DIAGNOSIS — H8110 Benign paroxysmal vertigo, unspecified ear: Secondary | ICD-10-CM | POA: Diagnosis not present

## 2023-07-20 DIAGNOSIS — I10 Essential (primary) hypertension: Secondary | ICD-10-CM

## 2023-07-20 DIAGNOSIS — Z0001 Encounter for general adult medical examination with abnormal findings: Secondary | ICD-10-CM

## 2023-07-20 DIAGNOSIS — N1831 Chronic kidney disease, stage 3a: Secondary | ICD-10-CM | POA: Diagnosis not present

## 2023-07-20 DIAGNOSIS — Z0000 Encounter for general adult medical examination without abnormal findings: Secondary | ICD-10-CM

## 2023-07-20 MED ORDER — ROSUVASTATIN CALCIUM 5 MG PO TABS: 5.0000 mg | ORAL_TABLET | Freq: Every day | ORAL | 3 refills | Status: DC

## 2023-07-20 MED ORDER — AMLODIPINE BESYLATE 5 MG PO TABS: 5.0000 mg | ORAL_TABLET | Freq: Every day | ORAL | 3 refills | Status: DC

## 2023-07-20 NOTE — Patient Instructions (Addendum)
You are VERY VERY CLOSE TO  developing type 2 diabetes based on your A1c of 6.2  and fasting glucose of 124     I recommend that you stop eating bowls of ice cream for dinner  and choose either a [place] yogurt   ( no artificial sweetener)  or a salad with A  protein  (checken , tuna,  egg salad) on lettuce (NOT CROISSANT OR BREAD)  Health[employer] Choice fudgsicle is a good choice if you buy ice cream because the serving size limits your carbs   Continue walking daily for exercise and make your goal a continuous 30 minute walk   You should be following a "Low glycemic index "  or Mediterranean style diet  (more protein/fiber,  less starches and refined sugars)   I have made a referral to dr Maurilio Lovely for your vertigo management

## 2023-07-20 NOTE — Progress Notes (Signed)
Patient ID: Maria Christensen, female    DOB: 04-01-1941  Age: 82 y.o. MRN: [id number]  The patient is here for annual preventive examination and management of other chronic and acute problems.   The risk factors are reflected in the social history.   The roster of all physicians providing medical care to patient - is listed in the Snapshot section of the chart.   Activities of daily living:  The patient is 100% independent in all ADLs: dressing, toileting, feeding as well as independent mobility   Home safety : The patient has smoke detectors in the home. They wear seatbelts.  There are no unsecured firearms at home. There is no violence in the home.    There is no risks for hepatitis, STDs or HIV. There is no   history of blood transfusion. They have no travel history to infectious disease endemic areas of the world.   The patient has seen their dentist in the last six month. They have seen their eye doctor in the last year. The patinet  denies slight hearing difficulty with regard to whispered voices and some television programs.  They have deferred audiologic testing in the last year.  They do not  have excessive sun exposure. Discussed the need for sun protection: hats, long sleeves and use of sunscreen if there is significant sun exposure.    Diet: the importance of a healthy diet is discussed. They do have a healthy diet.   The benefits of regular aerobic exercise were discussed. The patient  exercises  3 to 5 days per week  for  60 minutes.    Depression screen: there are no signs or vegative symptoms of depression- irritability, change in appetite, anhedonia, sadness/tearfullness.   The following portions of the patient's history were reviewed and updated as appropriate: allergies, current medications, past family history, past medical history,  past surgical history, past social history  and problem list.   Visual acuity was not assessed per patient preference since the patient has  regular follow up with an  ophthalmologist. Hearing and body mass index were assessed and reviewed.    During the course of the visit the patient was educated and counseled about appropriate screening and preventive services including : fall prevention , diabetes screening, nutrition counseling, colorectal cancer screening, and recommended immunizations.    Chief Complaint:  1) had a fall on Dec 4 caused by vertigo that started one day prior  to her fall.  She sustained  no injuries but bumped the back of her head and had an occiital headache for a day or two.  She has no prior history pf vertigo . Symptoms started in bed  and occurred with position change only. 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  She denies   tinnitus.   2) Recent labs reviewed: she has an elevated A1c and fasting glucose  of 124;  reviewed diet.  She has gained weight since moving to [place] and is eating more desserts  3) Hypertension: patient checks blood pressure twice weekly at home.  Readings have been for the most part <130/80 at rest . Patient is following a reduced salt diet most days and is taking medications as prescribed    4 CKD stage 3 avoiding NSAIDs   5) Aortic atherosclerosis: taking rosuvastatin    Review of Symptoms  Patient denies persistent headache, fevers, malaise, unintentional weight loss, skin rash, eye pain, sinus congestion and sinus pain, sore throat, dysphagia,  hemoptysis , cough, dyspnea, wheezing, chest pain, palpitations, orthopnea, edema, abdominal pain, nausea, melena, diarrhea, constipation, flank pain, dysuria, hematuria, urinary  Frequency, nocturia, numbness, tingling, seizures,  Focal weakness, Loss of consciousness,  Tremor, insomnia, depression, anxiety, and suicidal ideation.    Physical Exam:  BP 132/70   Pulse 82   Ht 5\' 4"  (1.626 m)   Wt 144 lb 12.8 oz (65.7 kg)   SpO2 97%   BMI 24.85 kg/m    Physical Exam Vitals reviewed.  Constitutional:       General: She is not in acute distress.    Appearance: Normal appearance. She is normal weight. She is not ill-appearing, toxic-appearing or diaphoretic.  HENT:     Head: Normocephalic.  Eyes:     General: No scleral icterus.       Right eye: No discharge.        Left eye: No discharge.     Conjunctiva/sclera: Conjunctivae normal.  Cardiovascular:     Rate and Rhythm: Normal rate and regular rhythm.     Heart sounds: Normal heart sounds.  Pulmonary:     Effort: Pulmonary effort is normal. No respiratory distress.     Breath sounds: Normal breath sounds.  Musculoskeletal:        General: Normal range of motion.  Skin:    General: Skin is warm and dry.  Neurological:     General: No focal deficit present.     Mental Status: She is alert and oriented to person, place, and time. Mental status is at baseline.  Psychiatric:        Mood and Affect: Mood normal.        Behavior: Behavior normal.        Thought Content: Thought content normal.        Judgment: Judgment normal.    Assessment and Plan: Benign paroxysmal positional vertigo, unspecified laterality -     Ambulatory referral to ENT  Hyperlipidemia with target LDL less than 160 -     Rosuvastatin Calcium; Take 1 tablet (5 mg total) by mouth daily.  Dispense: 90 tablet; Refill: 3  Prediabetes Assessment & Plan: Her  random glucose is 124 and her a1c is 6.2.  Encouraged to walk daily for exercise and reduce her refined sugar.  .  .  Lab Results  Component Value Date   NA 139 07/17/2023   K 4.6 07/17/2023   CL 108 07/17/2023   CO2 23 07/17/2023   Lab Results  Component Value Date   HGBA1C 6.2 07/17/2023     Orders: -     Hemoglobin A1c; Future -     Comprehensive metabolic panel; Future  Stage 3a chronic kidney disease (HCC) Assessment & Plan: REVIEWED THE LAST 3 YEARS OF LABS.  GFR IS RELATIVELY STABLE AND BP IS WELL CONTROLLED ON MINIMAL DOSE OF AMLODIPINE   Essential hypertension Assessment & Plan: Well  controlled on current regimen of amlodipine 5 mg daily.  Renal function stable, no changes today.    Encounter for preventive health examination Assessment & Plan: age appropriate education and counseling updated, referrals for preventative services and immunizations addressed, dietary and smoking counseling addressed, most recent labs reviewed.  I have personally reviewed and have noted:   1) the patient's medical and social history 2) The pt's use of alcohol, tobacco, and illicit drugs 3) The patient's current medications and supplements 4) Functional ability including ADL's, fall risk, home safety risk, hearing and visual impairment 5) Diet and physical activities  6) Evidence for depression or mood disorder 7) The patient's height, weight, and BMI have been recorded in the chart     I have made referrals, and provided counseling and education based on review of the above    History of recent fall Assessment & Plan: Secondary to loss of balance due to vertigo.  Referring to Erline Hau    B12 deficiency Assessment & Plan: Managed with sublingual  supplements  per patient preference .  Lab Results  Component Value Date   VITAMINB12 >1537 (H) 07/17/2023      Other orders -     amLODIPine Besylate; Take 1 tablet (5 mg total) by mouth daily.  Dispense: 90 tablet; Refill: 3    Return in about 3 months (around 10/19/2023) for repeat labs .  Sherlene Shams, MD

## 2023-07-22 DIAGNOSIS — Z9181 History of falling: Secondary | ICD-10-CM | POA: Insufficient documentation

## 2023-07-22 NOTE — Assessment & Plan Note (Signed)
Well controlled on current regimen of amlodipine 5 mg daily .  Renal function stable, no changes today. 

## 2023-07-22 NOTE — Assessment & Plan Note (Signed)
Managed with sublingual  supplements  per patient preference .  Lab Results  Component Value Date   [id number] >1537 (H) 07/17/2023

## 2023-07-22 NOTE — Assessment & Plan Note (Signed)
REVIEWED THE LAST 3 YEARS OF LABS.  GFR IS RELATIVELY STABLE AND BP IS WELL CONTROLLED ON MINIMAL DOSE OF AMLODIPINE

## 2023-07-22 NOTE — Assessment & Plan Note (Signed)
Her  random glucose is 124 and her a1c is 6.2.  Encouraged to walk daily for exercise and reduce her refined sugar.  .  .  Lab Results  Component Value Date   NA 139 07/17/2023   K 4.6 07/17/2023   CL 108 07/17/2023   CO2 23 07/17/2023   Lab Results  Component Value Date   HGBA1C 6.2 07/17/2023

## 2023-07-22 NOTE — Assessment & Plan Note (Signed)

## 2023-07-22 NOTE — Assessment & Plan Note (Signed)
Secondary to loss of balance due to vertigo.  Referring to [facility]

## 2023-07-25 ENCOUNTER — Encounter: Payer: Self-pay | Admitting: Internal Medicine

## 2023-07-25 ENCOUNTER — Telehealth: Payer: Self-pay | Admitting: Internal Medicine

## 2023-07-25 NOTE — Telephone Encounter (Signed)
Pt would like to be called regarding a referral to ENT

## 2023-07-26 NOTE — Telephone Encounter (Signed)
See telephone encounter.

## 2023-08-10 DIAGNOSIS — H8112 Benign paroxysmal vertigo, left ear: Secondary | ICD-10-CM | POA: Diagnosis not present

## 2023-08-29 DIAGNOSIS — H401113 Primary open-angle glaucoma, right eye, severe stage: Secondary | ICD-10-CM | POA: Diagnosis not present

## 2023-08-29 DIAGNOSIS — Z961 Presence of intraocular lens: Secondary | ICD-10-CM | POA: Diagnosis not present

## 2023-08-29 DIAGNOSIS — H401122 Primary open-angle glaucoma, left eye, moderate stage: Secondary | ICD-10-CM | POA: Diagnosis not present

## 2023-09-14 ENCOUNTER — Encounter: Payer: Self-pay | Admitting: Internal Medicine

## 2023-09-25 ENCOUNTER — Encounter: Payer: Self-pay | Admitting: Internal Medicine

## 2023-09-25 MED ORDER — AMLODIPINE BESYLATE 5 MG PO TABS: 5.0000 mg | ORAL_TABLET | Freq: Every day | ORAL | 3 refills | Status: DC

## 2023-10-05 DIAGNOSIS — H6123 Impacted cerumen, bilateral: Secondary | ICD-10-CM | POA: Diagnosis not present

## 2023-10-05 DIAGNOSIS — H90A31 Mixed conductive and sensorineural hearing loss, unilateral, right ear with restricted hearing on the contralateral side: Secondary | ICD-10-CM | POA: Diagnosis not present

## 2023-10-24 ENCOUNTER — Other Ambulatory Visit (INDEPENDENT_AMBULATORY_CARE_PROVIDER_SITE_OTHER): Payer: Medicare PPO

## 2023-10-24 DIAGNOSIS — R7303 Prediabetes: Secondary | ICD-10-CM | POA: Diagnosis not present

## 2023-10-24 LAB — COMPREHENSIVE METABOLIC PANEL
ALT: 11 U/L (ref 0–35)
AST: 16 U/L (ref 0–37)
Albumin: 4.3 g/dL (ref 3.5–5.2)
Alkaline Phosphatase: 59 U/L (ref 39–117)
BUN: 17 mg/dL (ref 6–23)
CO2: 23 meq/L (ref 19–32)
Calcium: 9.5 mg/dL (ref 8.4–10.5)
Chloride: 107 meq/L (ref 96–112)
Creatinine, Ser: 1.1 mg/dL (ref 0.40–1.20)
GFR: 46.42 mL/min — ABNORMAL LOW (ref >60.00)
Glucose, Bld: 100 mg/dL — ABNORMAL HIGH (ref 70–99)
Potassium: 4.7 meq/L (ref 3.5–5.1)
Sodium: 138 meq/L (ref 135–145)
Total Bilirubin: 1.8 mg/dL — ABNORMAL HIGH (ref 0.2–1.2)
Total Protein: 6.6 g/dL (ref 6.0–8.3)

## 2023-10-24 LAB — HEMOGLOBIN A1C: Hgb A1c MFr Bld: 6 % (ref 4.6–6.5)

## 2023-10-26 ENCOUNTER — Ambulatory Visit: Payer: Medicare PPO | Admitting: Internal Medicine

## 2023-10-26 ENCOUNTER — Encounter: Payer: Self-pay | Admitting: Internal Medicine

## 2023-10-26 VITALS — BP 134/66 | HR 75 | Ht 64.0 in | Wt 139.6 lb

## 2023-10-26 DIAGNOSIS — I10 Essential (primary) hypertension: Secondary | ICD-10-CM | POA: Diagnosis not present

## 2023-10-26 DIAGNOSIS — N1831 Chronic kidney disease, stage 3a: Secondary | ICD-10-CM | POA: Diagnosis not present

## 2023-10-26 DIAGNOSIS — E785 Hyperlipidemia, unspecified: Secondary | ICD-10-CM

## 2023-10-26 DIAGNOSIS — R7303 Prediabetes: Secondary | ICD-10-CM

## 2023-10-26 DIAGNOSIS — E875 Hyperkalemia: Secondary | ICD-10-CM

## 2023-10-26 LAB — MICROALBUMIN / CREATININE URINE RATIO
Creatinine,U: 241.5 mg/dL
Microalb Creat Ratio: 22.2 mg/g (ref 0.0–30.0)
Microalb, Ur: 5.4 mg/dL — ABNORMAL HIGH (ref 0.0–1.9)

## 2023-10-26 NOTE — Progress Notes (Addendum)
 Subjective:  Patient ID: Maria Christensen, female    DOB: 1941/01/28  Age: 83 y.o. MRN: [id number]  CC: The primary encounter diagnosis was Prediabetes. Diagnoses of Essential hypertension, Stage 3a chronic kidney disease (HCC), and Hyperlipidemia with target LDL less than 160 were also pertinent to this visit.   HPI Maria Christensen presents for  Chief Complaint  Patient presents with   Medical Management of Chronic Issues    3 month follow up    Follow up o prediabetes, hypertension and CKD .  Labs done prior to visit and reviewed today  HTN:  she has not checking BP since last visit. Taking amlodipine 5 mg daily  and BP has improved .  Marland Kitchen UaCr discussed   She had right sided gluteal/hip pain that radiated to knee but now below, pain lasted for 2 weeks ,  but resolved eventually with heating pad and tylenol ,  but was quite painful    Outpatient Medications Prior to Visit  Medication Sig Dispense Refill   brimonidine (ALPHAGAN P) 0.1 % SOLN Place 1 drop into both eyes 2 (two) times daily.     Cholecalciferol (VITAMIN D3) 50 MCG (2000 UT) capsule Take 2,000 Units by mouth daily.     dorzolamide-timolol (COSOPT) 22.3-6.8 MG/ML ophthalmic solution Place 1 drop into both eyes 2 (two) times daily.      LUMIGAN 0.01 % SOLN Place 1 drop into both eyes at bedtime.      rosuvastatin (CRESTOR) 5 MG tablet Take 1 tablet (5 mg total) by mouth daily. 90 tablet 3   vitamin B-12 (CYANOCOBALAMIN) 1000 MCG tablet Take 2,000 mcg by mouth daily.     amLODipine (NORVASC) 5 MG tablet Take 1 tablet (5 mg total) by mouth daily. 90 tablet 3   No facility-administered medications prior to visit.    Review of Systems;  Patient denies headache, fevers, malaise, unintentional weight loss, skin rash, eye pain, sinus congestion and sinus pain, sore throat, dysphagia,  hemoptysis , cough, dyspnea, wheezing, chest pain, palpitations, orthopnea, edema, abdominal pain, nausea, melena, diarrhea, constipation,  flank pain, dysuria, hematuria, urinary  Frequency, nocturia, numbness, tingling, seizures,  Focal weakness, Loss of consciousness,  Tremor, insomnia, depression, anxiety, and suicidal ideation.      Objective:  BP 134/66   Pulse 75   Ht 5\' 4"  (1.626 m)   Wt 139 lb 9.6 oz (63.3 kg)   SpO2 98%   BMI 23.96 kg/m   BP Readings from Last 3 Encounters:  10/26/23 134/66  07/20/23 132/70  01/12/23 120/74    Wt Readings from Last 3 Encounters:  10/26/23 139 lb 9.6 oz (63.3 kg)  07/20/23 144 lb 12.8 oz (65.7 kg)  01/12/23 140 lb 12.8 oz (63.9 kg)    Physical Exam Vitals reviewed.  Constitutional:      General: She is not in acute distress.    Appearance: Normal appearance. She is normal weight. She is not ill-appearing, toxic-appearing or diaphoretic.  HENT:     Head: Normocephalic.  Eyes:     General: No scleral icterus.       Right eye: No discharge.        Left eye: No discharge.     Conjunctiva/sclera: Conjunctivae normal.  Cardiovascular:     Rate and Rhythm: Normal rate and regular rhythm.     Heart sounds: Normal heart sounds.  Pulmonary:     Effort: Pulmonary effort is normal. No respiratory distress.     Breath sounds: Normal  breath sounds.  Musculoskeletal:        General: Normal range of motion.  Skin:    General: Skin is warm and dry.  Neurological:     General: No focal deficit present.     Mental Status: She is alert and oriented to person, place, and time. Mental status is at baseline.  Psychiatric:        Mood and Affect: Mood normal.        Behavior: Behavior normal.        Thought Content: Thought content normal.        Judgment: Judgment normal.    Lab Results  Component Value Date   HGBA1C 6.0 10/24/2023   HGBA1C 6.2 07/17/2023   HGBA1C 5.9 01/09/2023    Lab Results  Component Value Date   CREATININE 1.10 10/24/2023   CREATININE 1.12 07/17/2023   CREATININE 1.04 01/09/2023    Lab Results  Component Value Date   WBC 7.0 07/17/2023    HGB 15.4 (H) 07/17/2023   HCT 46.5 (H) 07/17/2023   PLT 204.0 07/17/2023   GLUCOSE 100 (H) 10/24/2023   CHOL 135 07/17/2023   TRIG 117.0 07/17/2023   HDL 48.20 07/17/2023   LDLDIRECT 61.0 07/08/2022   LDLCALC 63 07/17/2023   ALT 11 10/24/2023   AST 16 10/24/2023   NA 138 10/24/2023   K 4.7 10/24/2023   CL 107 10/24/2023   CREATININE 1.10 10/24/2023   BUN 17 10/24/2023   CO2 23 10/24/2023   TSH 1.69 07/08/2022   HGBA1C 6.0 10/24/2023   MICROALBUR 5.4 (H) 10/26/2023    No results found.  Assessment & Plan:  .Prediabetes  Essential hypertension Assessment & Plan: Changing amlodipine to losartan for management of early microalbuminuria.  Return in 1-2 weeks for BMET   Orders: -     Microalbumin / creatinine urine ratio; Future -     Losartan Potassium; Take 1 tablet (100 mg total) by mouth daily.  Dispense: 90 tablet; Refill: 0 -     Basic metabolic panel; Future  Stage 3a chronic kidney disease (HCC) Assessment & Plan: REVIEWED THE LAST 3 YEARS OF LABS.  GFR IS RELATIVELY STABLE AND BP IS WELL CONTROLLED ON MINIMAL DOSE OF AMLODIPINE.. however  UaCr and microalbuminuri have increased slightly,  will change amlodipine to ARB   Lab Results  Component Value Date   CREATININE 1.10 10/24/2023   Lab Results  Component Value Date   NA 138 10/24/2023   K 4.7 10/24/2023   CL 107 10/24/2023   CO2 23 10/24/2023   Lab Results  Component Value Date   MICROALBUR 5.4 (H) 10/26/2023   MICROALBUR 2.9 (H) 07/08/2022        Orders: -     Microalbumin / creatinine urine ratio; Future -     Losartan Potassium; Take 1 tablet (100 mg total) by mouth daily.  Dispense: 90 tablet; Refill: 0 -     Basic metabolic panel; Future  Hyperlipidemia with target LDL less than 160 Assessment & Plan: LDL is at goal on lowest dose of rosuvastatin.Marland Kitchen  no changes today   Lab Results  Component Value Date   CHOL 135 07/17/2023   HDL 48.20 07/17/2023   LDLCALC 63 07/17/2023   LDLDIRECT  61.0 07/08/2022   TRIG 117.0 07/17/2023   CHOLHDL 3 07/17/2023   Lab Results  Component Value Date   ALT 11 10/24/2023   AST 16 10/24/2023   ALKPHOS 59 10/24/2023   BILITOT 1.8 (H) 10/24/2023  I spent 34 minutes on the day of this face to face encounter reviewing patient's  most recent labs,  prior relevant surgical and non surgical procedures, recent  labs and imaging studies, counseling on CKD,  reviewing the assessment and plan with patient, and post visit ordering and reviewing of  diagnostics and therapeutics with patient  .   Follow-up: Return in about 6 months (around 04/27/2024).   Sherlene Shams, MD

## 2023-10-26 NOTE — Assessment & Plan Note (Addendum)
 LDL is at goal on lowest dose of rosuvastatin.Marland Kitchen  no changes today   Lab Results  Component Value Date   CHOL 135 07/17/2023   HDL 48.20 07/17/2023   LDLCALC 63 07/17/2023   LDLDIRECT 61.0 07/08/2022   TRIG 117.0 07/17/2023   CHOLHDL 3 07/17/2023   Lab Results  Component Value Date   ALT 11 10/24/2023   AST 16 10/24/2023   ALKPHOS 59 10/24/2023   BILITOT 1.8 (H) 10/24/2023

## 2023-10-26 NOTE — Patient Instructions (Addendum)
 Your blood pressure  is better on your current dose of  amlodipine.  We reviewed the message from [facility] about a miscalculation on the test that measures the albumin to creatinine ratio which helps [place] determine if early kidney disease is present. I have reviewed your most recent screening test for nephropathy, which was done in 2023 and the corrected ratio is  19.   Anything under  30 does not warrant a change in medication .  We will repeat today and if > 30 I will recommend a change in blood pressure medication .     Our goal is to keep your blood pressure  between 120/70 and 130/80   Your kidney function remains slightly lower than normal but is stable.  Avoiding regular use of NSAIDS (anti inflammatories such as ibuprofen  And naproxen )  use of tylenol is recommended and maintaining normal blood pressure and cholesterol have been shown to delay progression of kidney disease for years.     For pain management,  You can take up to 2000 mg of acetominophen (tylenol) every day safely  In divided doses (500 mg every 6 hours  Or 1000 mg every 12 hours.)    Please have the [facility] Nurse  check your  BP once a month and send me readings in 3 months   You are still " PREDIABETIC"  , you can have " no sugar added"  ice cream

## 2023-10-26 NOTE — Assessment & Plan Note (Addendum)
 REVIEWED THE LAST 3 YEARS OF LABS.  GFR IS RELATIVELY STABLE AND BP IS WELL CONTROLLED ON MINIMAL DOSE OF AMLODIPINE .. however  UaCr and microalbuminuri have increased slightly,  and amlodipine  was changed to ARB  but GFR dropped,  on repeat testing  done on April 9.  ARB was discontinued and amlodipine  resumed; however  GFR has continue to decline based on May 12 labs.  She is not taking NSAIDs.Aaron Aas   referring to nephrology    Lab Results  Component Value Date   CREATININE 1.10 10/24/2023   Lab Results  Component Value Date   NA 138 10/24/2023   K 4.7 10/24/2023   CL 107 10/24/2023   CO2 23 10/24/2023   Lab Results  Component Value Date   MICROALBUR 5.4 (H) 10/26/2023   MICROALBUR 2.9 (H) 07/08/2022

## 2023-10-27 ENCOUNTER — Encounter: Payer: Self-pay | Admitting: Internal Medicine

## 2023-10-28 ENCOUNTER — Encounter: Payer: Self-pay | Admitting: Internal Medicine

## 2023-10-28 MED ORDER — LOSARTAN POTASSIUM 100 MG PO TABS: 100.0000 mg | ORAL_TABLET | Freq: Every day | ORAL | 0 refills | Status: DC

## 2023-10-28 NOTE — Assessment & Plan Note (Addendum)
 After changing amlodipine to losartan for management of early microalbuminuria., she developed hyperkalemia and a decrease in GFR.  Will stop losartan and resume amlodipine

## 2023-10-28 NOTE — Addendum Note (Signed)
 Addended by: Sherlene Shams on: 10/28/2023 10:44 AM   Modules accepted: Orders

## 2023-10-30 ENCOUNTER — Encounter: Payer: Self-pay | Admitting: Internal Medicine

## 2023-11-06 ENCOUNTER — Encounter: Payer: Self-pay | Admitting: Internal Medicine

## 2023-11-06 ENCOUNTER — Other Ambulatory Visit (INDEPENDENT_AMBULATORY_CARE_PROVIDER_SITE_OTHER)

## 2023-11-06 DIAGNOSIS — I10 Essential (primary) hypertension: Secondary | ICD-10-CM

## 2023-11-06 DIAGNOSIS — N1831 Chronic kidney disease, stage 3a: Secondary | ICD-10-CM

## 2023-11-06 LAB — BASIC METABOLIC PANEL WITH GFR
BUN: 18 mg/dL (ref 6–23)
CO2: 26 meq/L (ref 19–32)
Calcium: 10.1 mg/dL (ref 8.4–10.5)
Chloride: 108 meq/L (ref 96–112)
Creatinine, Ser: 1.08 mg/dL (ref 0.40–1.20)
GFR: 47.44 mL/min — ABNORMAL LOW (ref >60.00)
Glucose, Bld: 95 mg/dL (ref 70–99)
Potassium: 5.2 meq/L — ABNORMAL HIGH (ref 3.5–5.1)
Sodium: 141 meq/L (ref 135–145)

## 2023-11-06 NOTE — Telephone Encounter (Signed)
 noted

## 2023-11-07 ENCOUNTER — Encounter: Payer: Self-pay | Admitting: Internal Medicine

## 2023-11-07 NOTE — Assessment & Plan Note (Signed)
 Occurred with change in antihypertensive from amlodipine to losartan.  Will repeat in one week   Lab Results  Component Value Date   NA 141 11/06/2023   K 5.2 No hemolysis seen (H) 11/06/2023   CL 108 11/06/2023   CO2 26 11/06/2023

## 2023-11-07 NOTE — Addendum Note (Signed)
 Addended by: Sherlene Shams on: 11/07/2023 09:00 PM   Modules accepted: Orders

## 2023-11-07 NOTE — Telephone Encounter (Signed)
 Received a fax from [facility] [place] and pt bp yesterday was 138/63.

## 2023-11-08 ENCOUNTER — Encounter: Payer: Self-pay | Admitting: Internal Medicine

## 2023-11-15 ENCOUNTER — Ambulatory Visit

## 2023-11-15 ENCOUNTER — Other Ambulatory Visit (INDEPENDENT_AMBULATORY_CARE_PROVIDER_SITE_OTHER)

## 2023-11-15 DIAGNOSIS — E875 Hyperkalemia: Secondary | ICD-10-CM

## 2023-11-15 LAB — BASIC METABOLIC PANEL WITH GFR
BUN: 18 mg/dL (ref 6–23)
CO2: 24 meq/L (ref 19–32)
Calcium: 9.7 mg/dL (ref 8.4–10.5)
Chloride: 109 meq/L (ref 96–112)
Creatinine, Ser: 1.22 mg/dL — ABNORMAL HIGH (ref 0.40–1.20)
GFR: 40.98 mL/min — ABNORMAL LOW (ref >60.00)
Glucose, Bld: 140 mg/dL — ABNORMAL HIGH (ref 70–99)
Potassium: 4.6 meq/L (ref 3.5–5.1)
Sodium: 141 meq/L (ref 135–145)

## 2023-11-16 ENCOUNTER — Encounter: Payer: Self-pay | Admitting: Internal Medicine

## 2023-11-16 MED ORDER — AMLODIPINE BESYLATE 5 MG PO TABS: 5.0000 mg | ORAL_TABLET | Freq: Every day | ORAL | 3 refills | Status: DC

## 2023-11-16 NOTE — Addendum Note (Signed)
 Addended by: Sherlene Shams on: 11/16/2023 10:01 PM   Modules accepted: Orders

## 2023-11-16 NOTE — Addendum Note (Signed)
 Addended by: Sherlene Shams on: 11/16/2023 10:06 PM   Modules accepted: Orders

## 2023-11-20 NOTE — Telephone Encounter (Signed)
 I have faxed the results to [facility].

## 2023-11-21 ENCOUNTER — Ambulatory Visit (INDEPENDENT_AMBULATORY_CARE_PROVIDER_SITE_OTHER): Payer: Medicare PPO

## 2023-11-21 ENCOUNTER — Encounter: Payer: Self-pay | Admitting: Internal Medicine

## 2023-11-21 VITALS — BP 134/66 | Ht 64.0 in | Wt 137.0 lb

## 2023-11-21 DIAGNOSIS — Z0000 Encounter for general adult medical examination without abnormal findings: Secondary | ICD-10-CM | POA: Diagnosis not present

## 2023-11-21 DIAGNOSIS — Z2821 Immunization not carried out because of patient refusal: Secondary | ICD-10-CM | POA: Diagnosis not present

## 2023-11-21 NOTE — Progress Notes (Signed)
 Because this visit was a virtual/telehealth visit,  certain criteria was not obtained, such a blood pressure, CBG if applicable, and timed get up and go. Any medications not marked as "taking" were not mentioned during the medication reconciliation part of the visit. Any vitals not documented were not able to be obtained due to this being a telehealth visit or patient was unable to self-report a recent blood pressure reading due to a lack of equipment at home via telehealth. Vitals that have been documented are verbally provided by the patient.   Subjective:   Maria Christensen is a 83 y.o. who presents for a Medicare Wellness preventive visit.  Visit Complete: Virtual I connected with  Maria Christensen on 11/21/23 by a audio enabled telemedicine application and verified that I am speaking with the correct person using two identifiers.  Patient Location: Home  Provider Location: Home Office  I discussed the limitations of evaluation and management by telemedicine. The patient expressed understanding and agreed to proceed.  Vital Signs: Because this visit was a virtual/telehealth visit, some criteria may be missing or patient reported. Any vitals not documented were not able to be obtained and vitals that have been documented are patient reported.  VideoDeclined- This patient declined Librarian, academic. Therefore the visit was completed with audio only.  Persons Participating in Visit: Patient.  AWV Questionnaire: Yes: Patient Medicare AWV questionnaire was completed by the patient on 11/15/2023; I have confirmed that all information answered by patient is correct and no changes since this date.  Cardiac Risk Factors include: advanced age (>36men, >29 women);hypertension     Objective:    Today's Vitals   11/21/23 0943  BP: 134/66  Weight: 137 lb (62.1 kg)  Height: 5\' 4"  (1.626 m)   Body mass index is 23.52 kg/m.     11/21/2023    9:41 AM 11/18/2022     9:28 AM 12/16/2020   12:27 PM 05/12/2017    9:18 AM 04/01/2015   10:06 AM  Advanced Directives  Does Patient Have a Medical Advance Directive? Yes Yes Yes Yes Yes  Type of Estate agent of [place];Living will Healthcare Power of [place];Living will Healthcare Power of [place];Living will Living will;Healthcare Power of Attorney   Does patient want to make changes to medical advance directive? No - Patient declined No - Patient declined No - Patient declined No - Patient declined   Copy of Healthcare Power of Attorney in Chart? Yes - validated most recent copy scanned in chart (See row information) No - copy requested No - copy requested Yes     Current Medications (verified) Outpatient Encounter Medications as of 11/21/2023  Medication Sig   amLODipine (NORVASC) 5 MG tablet Take 1 tablet (5 mg total) by mouth daily.   brimonidine (ALPHAGAN P) 0.1 % SOLN Place 1 drop into both eyes 2 (two) times daily.   Cholecalciferol (VITAMIN D3) 50 MCG (2000 UT) capsule Take 2,000 Units by mouth daily.   dorzolamide-timolol (COSOPT) 22.3-6.8 MG/ML ophthalmic solution Place 1 drop into both eyes 2 (two) times daily.    LUMIGAN 0.01 % SOLN Place 1 drop into both eyes at bedtime.    rosuvastatin (CRESTOR) 5 MG tablet Take 1 tablet (5 mg total) by mouth daily.   vitamin B-12 (CYANOCOBALAMIN) 1000 MCG tablet Take 2,000 mcg by mouth daily.   No facility-administered encounter medications on file as of 11/21/2023.    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    Past Surgical History:  Procedure Laterality Date   ABDOMINAL  HYSTERECTOMY     BUNIONECTOMY  2002   CATARACT EXTRACTION W/PHACO Left 04/01/2015   Procedure: CATARACT EXTRACTION PHACO AND INTRAOCULAR LENS PLACEMENT (IOC);  Surgeon: Lockie Mola, MD;  Location: [facility];  Service: Ophthalmology;  Laterality: Left;   CATARACT EXTRACTION W/PHACO Right 12/16/2020   Procedure: CATARACT EXTRACTION PHACO AND INTRAOCULAR LENS PLACEMENT (IOC) RIGHT KAHOOK DUAL BLADE GONIOTOMY IV ZOFRAN 5.11 00:56.2 9.1%;  Surgeon: Lockie Mola, MD;  Location: [facility];  Service: Ophthalmology;  Laterality: Right;   CLEFT PALATE REPAIR     Family History  Problem Relation Age of Onset   Osteoporosis Mother    Cancer Father        colon   Heart attack Brother    Diabetes Brother    Glaucoma Brother 44       small cell lung CA   Hypertension Brother    Social History   Socioeconomic History   Marital status: Widowed    Spouse name: Not on file   Number of children: Not on file   Years of education: Not on file   Highest education level: Bachelor's degree (e.g., BA, AB, BS)  Occupational History   Occupation: retired  Tobacco Use   Smoking status: Former    Current packs/day: 0.00    Types: Cigarettes    Quit date: 11/15/1961    Years since quitting: 62.0   Smokeless tobacco: Never  Vaping Use   Vaping status: Never Used  Substance and Sexual Activity   Alcohol use: No   Drug use: No   Sexual activity: Not on file  Other Topics Concern   Not on file  Social History Narrative   Lives alone   Social Drivers of Health   Financial Resource Strain: Low Risk  (11/21/2023)   Overall Financial Resource Strain (CARDIA)    Difficulty of Paying Living Expenses: Not hard at all  Food Insecurity: No Food Insecurity (11/21/2023)   Hunger Vital Sign    Worried About Running Out of Food in the Last Year: Never true    Ran Out of Food in the Last Year: Never true  Transportation Needs: No Transportation Needs (11/21/2023)   PRAPARE -  Administrator, Civil Service (Medical): No    Lack of Transportation (Non-Medical): No  Physical Activity: Sufficiently Active (11/21/2023)   Exercise Vital Sign    Days of Exercise per Week: 7 days    Minutes of Exercise per Session: 30 min  Stress: No Stress Concern Present (11/21/2023)   [employer] of Occupational Health - Occupational Stress Questionnaire    Feeling of Stress : Not at all  Social Connections: Moderately Isolated (11/21/2023)   Social Connection and Isolation Panel [NHANES]    Frequency of Communication with Friends and Family: More than three times a week    Frequency of Social Gatherings with Friends and Family: More than three times a week    Attends Religious Services: More than 4 times per year  Active Member of Clubs or Organizations: No    Attends Banker Meetings: Never    Marital Status: Widowed    Tobacco Counseling Counseling given: Not Answered    Clinical Intake:  Pre-visit preparation completed: Yes  Pain : No/denies pain     BMI - recorded: 23.52 Nutritional Status: BMI of 19-24  Normal Nutritional Risks: None Diabetes: No  Lab Results  Component Value Date   HGBA1C 6.0 10/24/2023   HGBA1C 6.2 07/17/2023   HGBA1C 5.9 01/09/2023     How often do you need to have someone help you when you read instructions, pamphlets, or other written materials from your doctor or pharmacy?: 1 - Never What is the last grade level you completed in school?: College graduate     Information entered by :: Estell Harpin   Activities of Daily Living     11/15/2023    5:12 PM  In your present state of health, do you have any difficulty performing the following activities:  Hearing? 0  Vision? 0  Difficulty concentrating or making decisions? 0  Walking or climbing stairs? 0  Dressing or bathing? 0  Doing errands, shopping? 0  Preparing Food and eating ? N  Using the Toilet? N  In the past six months, have  you accidently leaked urine? N  Do you have problems with loss of bowel control? N  Managing your Medications? N  Managing your Finances? N  Housekeeping or managing your Housekeeping? N    Patient Care Team: Sherlene Shams, MD as PCP - General (Internal Medicine)  Indicate any recent Medical Services you may have received from other than [facility] providers in the past year (date may be approximate).     Assessment:   This is a routine wellness examination for [facility].  Hearing/Vision screen Hearing Screening - Comments:: Patient wears hearing aids Vision Screening - Comments:: Patient wears glasses   Goals Addressed             This Visit's Progress    Patient Stated       To live life positively        Depression Screen     11/21/2023    9:49 AM 10/26/2023    8:42 AM 07/20/2023    8:39 AM 01/12/2023   10:09 AM 11/18/2022    9:25 AM 07/13/2022    1:38 PM 07/12/2021    9:36 AM  PHQ 2/9 Scores  PHQ - 2 Score 0 0 0 0 0 0 0  PHQ- 9 Score 1   0       Fall Risk     11/15/2023    5:12 PM 10/26/2023    8:42 AM 07/20/2023    8:38 AM 01/12/2023   10:09 AM 11/18/2022    9:31 AM  Fall Risk   Falls in the past year? 1 1 1  0 0  Number falls in past yr: 0 0 0 0 0  Injury with Fall? 0 0 1 0 0  Risk for fall due to : History of fall(s) History of fall(s) History of fall(s) No Fall Risks   Follow up Falls evaluation completed Falls evaluation completed Falls evaluation completed Falls evaluation completed Falls evaluation completed;Falls prevention discussed    MEDICARE RISK AT HOME:  Medicare Risk at Home Any stairs in or around the home?: (Patient-Rptd) No If so, are there any without handrails?: Yes Home free of loose throw rugs in walkways, pet beds, electrical cords, etc?: (Patient-Rptd) Yes Adequate  lighting in your home to reduce risk of falls?: (Patient-Rptd) Yes Life alert?: (Patient-Rptd) Yes Use of a cane, walker or w/c?: (Patient-Rptd) No Grab bars in the bathroom?:  (Patient-Rptd) Yes Shower chair or bench in shower?: (Patient-Rptd) No Elevated toilet seat or a handicapped toilet?: (Patient-Rptd) No  TIMED UP AND GO:  Was the test performed?  No  Cognitive Function: 6CIT completed    05/12/2017    9:52 AM  MMSE - Mini Mental State Exam  Orientation to time 5  Orientation to Place 5  Registration 3  Attention/ Calculation 5  Recall 3  Language- name 2 objects 2  Language- repeat 1  Language- follow 3 step command 3  Language- read & follow direction 1  Write a sentence 1  Copy design 1  Total score 30        11/21/2023    9:45 AM 11/18/2022    9:33 AM  6CIT Screen  What Year? 0 points 0 points  What month? 0 points 0 points  What time? 0 points 0 points  Count back from 20 0 points 0 points  Months in reverse 0 points 0 points  Repeat phrase 0 points 0 points  Total Score 0 points 0 points    Immunizations Immunization History  Administered Date(s) Administered   Fluad Quad(high Dose 65+) 04/05/2019, 04/15/2020   Influenza Split 05/17/2012   Influenza Whole 05/17/2012   Influenza, High Dose Seasonal PF 05/26/2015, 05/12/2016, 05/03/2017, 04/20/2021, 05/18/2022   Influenza,inj,Quad PF,6+ Mos 05/28/2013, 05/03/2014   Influenza-Unspecified 06/03/2018, 05/10/2023   Moderna Covid-19 Vaccine Bivalent Booster 16yrs & up 05/18/2022   Moderna Sars-Covid-2 Vaccination 05/10/2023   PFIZER(Purple Top)SARS-COV-2 Vaccination 08/20/2019, 09/09/2019, 05/04/2020, 01/15/2021   PNEUMOCOCCAL CONJUGATE-20 07/12/2021   Pfizer Covid-19 Vaccine Bivalent Booster 39yrs & up 05/04/2021   Pfizer(Comirnaty)Fall Seasonal Vaccine 12 years and older 05/10/2023   Pneumococcal Conjugate-13 10/29/2014   Pneumococcal Polysaccharide-23 04/28/2010   Respiratory Syncytial Virus Vaccine,Recomb Aduvanted(Arexvy) 05/05/2022   Td 02/14/2009   Tdap 03/04/2009, 07/28/2020, 01/28/2021   Zoster Recombinant(Shingrix) 12/05/2016, 03/15/2017   Zoster, Live 06/12/2006     Screening Tests Health Maintenance  Topic Date Due   COVID-19 Vaccine (8 - Pfizer risk 2024-25 season) 11/08/2023   INFLUENZA VACCINE  03/08/2024   Medicare Annual Wellness (AWV)  11/20/2024   DTaP/Tdap/Td (5 - Td or Tdap) 01/29/2031   Pneumonia Vaccine 72+ Years old  Completed   DEXA SCAN  Completed   Zoster Vaccines- Shingrix  Completed   HPV VACCINES  Aged Out   Meningococcal B Vaccine  Aged Out   Hepatitis C Screening  Discontinued    Health Maintenance  Health Maintenance Due  Topic Date Due   COVID-19 Vaccine (8 - Pfizer risk 2024-25 season) 11/08/2023   Health Maintenance Items Addressed:   Additional Screening:  Vision Screening: Recommended annual ophthalmology exams for early detection of glaucoma and other disorders of the eye.  Dental Screening: Recommended annual dental exams for proper oral hygiene  Community Resource Referral / Chronic Care Management: CRR required this visit?  No   CCM required this visit?  No     Plan:     I have personally reviewed and noted the following in the patient's chart:   Medical and social history Use of alcohol, tobacco or illicit drugs  Current medications and supplements including opioid prescriptions. Patient is not currently taking opioid prescriptions. Functional ability and status Nutritional status Physical activity Advanced directives List of other physicians Hospitalizations, surgeries, and ER visits in  previous 12 months Vitals Screenings to include cognitive, depression, and falls Referrals and appointments  In addition, I have reviewed and discussed with patient certain preventive protocols, quality metrics, and best practice recommendations. A written personalized care plan for preventive services as well as general preventive health recommendations were provided to patient.     Freeda Jerry, [place]   11/21/2023   After Visit Summary: (MyChart) Due to this being a telephonic visit, the  after visit summary with patients personalized plan was offered to patient via MyChart   Notes: Nothing significant to report at this time.

## 2023-11-21 NOTE — Patient Instructions (Signed)
 Maria Christensen , Thank you for taking time to come for your Medicare Wellness Visit. I appreciate your ongoing commitment to your health goals. Please review the following plan we discussed and let me know if I can assist you in the future.   Referrals/Orders/Follow-Ups/Clinician Recommendations: follow up in 1 year for next scheduled AWV.  This is a list of the screening recommended for you and due dates:  Health Maintenance  Topic Date Due   COVID-19 Vaccine (8 - Pfizer risk 2024-25 season) 11/08/2023   Flu Shot  03/08/2024   Medicare Annual Wellness Visit  11/20/2024   DTaP/Tdap/Td vaccine (5 - Td or Tdap) 01/29/2031   Pneumonia Vaccine  Completed   DEXA scan (bone density measurement)  Completed   Zoster (Shingles) Vaccine  Completed   HPV Vaccine  Aged Out   Meningitis B Vaccine  Aged Out   Hepatitis C Screening  Discontinued    Advanced directives: (In Chart) A copy of your advanced directives are scanned into your chart should your provider ever need it.  Next Medicare Annual Wellness Visit scheduled for next year: Yes

## 2023-11-21 NOTE — Progress Notes (Deleted)
 Because this visit was a virtual/telehealth visit,  certain criteria was not obtained, such a blood pressure, CBG if applicable, and timed get up and go. Any medications not marked as "taking" were not mentioned during the medication reconciliation part of the visit. Any vitals not documented were not able to be obtained due to this being a telehealth visit or patient was unable to self-report a recent blood pressure reading due to a lack of equipment at home via telehealth. Vitals that have been documented are verbally provided by the patient.  Subjective:   Maria Christensen is a 83 y.o. who presents for a Medicare Wellness preventive visit.  Visit Complete: Virtual I connected with  Maria Christensen on 11/21/23 by a audio enabled telemedicine application and verified that I am speaking with the correct person using two identifiers.  Patient Location: Home  Provider Location: Home Office  I discussed the limitations of evaluation and management by telemedicine. The patient expressed understanding and agreed to proceed.  Vital Signs: Because this visit was a virtual/telehealth visit, some criteria may be missing or patient reported. Any vitals not documented were not able to be obtained and vitals that have been documented are patient reported.  {AWVVIDEO:32072}  Persons Participating in Visit: {Persons Participating in Visit:32444}  AWV Questionnaire: {AWVQuestionnaire:32338}  Cardiac Risk Factors include: advanced age (>66men, >26 women);hypertension     Objective:    Today's Vitals   11/21/23 0943  BP: 134/66  Weight: 137 lb (62.1 kg)  Height: 5\' 4"  (1.626 m)   Body mass index is 23.52 kg/m.     11/21/2023    9:41 AM 11/18/2022    9:28 AM 12/16/2020   12:27 PM 05/12/2017    9:18 AM 04/01/2015   10:06 AM  Advanced Directives  Does Patient Have a Medical Advance Directive? Yes Yes Yes Yes Yes  Type of Estate agent of [place];Living will Healthcare  Power of [place];Living will Healthcare Power of [place];Living will Living will;Healthcare Power of Attorney   Does patient want to make changes to medical advance directive? No - Patient declined No - Patient declined No - Patient declined No - Patient declined   Copy of Healthcare Power of Attorney in Chart? Yes - validated most recent copy scanned in chart (See row information) No - copy requested No - copy requested Yes     Current Medications (verified) Outpatient Encounter Medications as of 11/21/2023  Medication Sig   amLODipine (NORVASC) 5 MG tablet Take 1 tablet (5 mg total) by mouth daily.   brimonidine (ALPHAGAN P) 0.1 % SOLN Place 1 drop into both eyes 2 (two) times daily.   Cholecalciferol (VITAMIN D3) 50 MCG (2000 UT) capsule Take 2,000 Units by mouth daily.   dorzolamide-timolol (COSOPT) 22.3-6.8 MG/ML ophthalmic solution Place 1 drop into both eyes 2 (two) times daily.    LUMIGAN 0.01 % SOLN Place 1 drop into both eyes at bedtime.    rosuvastatin (CRESTOR) 5 MG tablet Take 1 tablet (5 mg total) by mouth daily.   vitamin B-12 (CYANOCOBALAMIN) 1000 MCG tablet Take 2,000 mcg by mouth daily.   No facility-administered encounter medications on file as of 11/21/2023.    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    Past Surgical History:  Procedure Laterality Date   ABDOMINAL HYSTERECTOMY     BUNIONECTOMY  2002   CATARACT EXTRACTION W/PHACO Left 04/01/2015   Procedure: CATARACT EXTRACTION PHACO AND INTRAOCULAR LENS PLACEMENT (IOC);  Surgeon: Annell Kidney, MD;  Location: [facility];  Service:  Ophthalmology;  Laterality: Left;   CATARACT EXTRACTION W/PHACO Right 12/16/2020   Procedure: CATARACT EXTRACTION PHACO AND INTRAOCULAR LENS PLACEMENT (IOC) RIGHT KAHOOK DUAL BLADE GONIOTOMY IV ZOFRAN 5.11 00:56.2 9.1%;  Surgeon: Annell Kidney, MD;  Location: [facility];  Service: Ophthalmology;  Laterality: Right;   CLEFT PALATE REPAIR     Family History  Problem Relation Age of Onset   Osteoporosis Mother    Cancer Father        colon   Heart attack Brother    Diabetes Brother    Glaucoma Brother 48       small cell lung CA   Hypertension Brother    Social History   Socioeconomic History   Marital status: Widowed    Spouse name: Not on file   Number of children: Not on file   Years of education: Not on file   Highest education level: Bachelor's degree (e.g., BA, AB, BS)  Occupational History   Occupation: retired  Tobacco Use   Smoking status: Former    Current packs/day: 0.00    Types: Cigarettes    Quit date: 11/15/1961    Years since quitting: 62.0   Smokeless tobacco: Never  Vaping Use   Vaping status: Never Used  Substance and Sexual Activity   Alcohol use: No   Drug use: No   Sexual activity: Not on file  Other Topics Concern   Not on file  Social History Narrative   Lives alone   Social Drivers of Health   Financial Resource Strain: Low Risk  (11/21/2023)   Overall Financial Resource Strain (CARDIA)    Difficulty of Paying Living Expenses: Not hard at all  Food Insecurity: No Food Insecurity (11/21/2023)   Hunger Vital Sign    Worried About Running Out of Food in the Last Year: Never true    Ran Out of Food in the Last Year: Never true  Transportation Needs: No Transportation Needs (11/21/2023)   PRAPARE - Administrator, Civil Service (Medical): No    Lack of Transportation (Non-Medical): No  Physical Activity: Sufficiently Active (11/21/2023)   Exercise Vital Sign    Days of Exercise per Week: 7 days    Minutes of Exercise  per Session: 30 min  Stress: No Stress Concern Present (11/21/2023)   [employer] of Occupational Health - Occupational Stress Questionnaire    Feeling of Stress : Not at all  Social Connections: Moderately Isolated (11/21/2023)   Social Connection and Isolation Panel [NHANES]    Frequency of Communication with Friends and Family: More than three times a week    Frequency of Social Gatherings with Friends and Family: More than three times a week    Attends Religious Services: More than 4 times per year    Active Member of [employer] or Organizations: No    Attends Banker Meetings: Never    Marital Status: Widowed    Tobacco Counseling Counseling given: Not Answered    Clinical Intake:  Pre-visit preparation completed:  Yes  Pain : No/denies pain     BMI - recorded: 23.52 Nutritional Status: BMI of 19-24  Normal Nutritional Risks: None Diabetes: No  Lab Results  Component Value Date   HGBA1C 6.0 10/24/2023   HGBA1C 6.2 07/17/2023   HGBA1C 5.9 01/09/2023     How often do you need to have someone help you when you read instructions, pamphlets, or other written materials from your doctor or pharmacy?: 1 - Never What is the last grade level you completed in school?: College graduate     Information entered by :: Estell Harpin   Activities of Daily Living ***    11/15/2023    5:12 PM  In your present state of health, do you have any difficulty performing the following activities:  Hearing? 0  Vision? 0  Difficulty concentrating or making decisions? 0  Walking or climbing stairs? 0  Dressing or bathing? 0  Doing errands, shopping? 0  Preparing Food and eating ? N  Using the Toilet? N  In the past six months, have you accidently leaked urine? N  Do you have problems with loss of bowel control? N  Managing your Medications? N  Managing your Finances? N  Housekeeping or managing your Housekeeping? N    Patient Care Team: Sherlene Shams, MD  as PCP - General (Internal Medicine)  Indicate any recent Medical Services you may have received from other than [facility] providers in the past year (date may be approximate).     Assessment:   This is a routine wellness examination for [facility].  Hearing/Vision screen Hearing Screening - Comments:: Patient wears hearing aids Vision Screening - Comments:: Patient wears glasses   Goals Addressed             This Visit's Progress    Patient Stated       To live life positively        Depression Screen ***    11/21/2023    9:49 AM 10/26/2023    8:42 AM 07/20/2023    8:39 AM 01/12/2023   10:09 AM 11/18/2022    9:25 AM 07/13/2022    1:38 PM 07/12/2021    9:36 AM  PHQ 2/9 Scores  PHQ - 2 Score 0 0 0 0 0 0 0  PHQ- 9 Score 1   0       Fall Risk ***    11/15/2023    5:12 PM 10/26/2023    8:42 AM 07/20/2023    8:38 AM 01/12/2023   10:09 AM 11/18/2022    9:31 AM  Fall Risk   Falls in the past year? 1 1 1  0 0  Number falls in past yr: 0 0 0 0 0  Injury with Fall? 0 0 1 0 0  Risk for fall due to : History of fall(s) History of fall(s) History of fall(s) No Fall Risks   Follow up Falls evaluation completed Falls evaluation completed Falls evaluation completed Falls evaluation completed Falls evaluation completed;Falls prevention discussed    MEDICARE RISK AT HOME: *** Medicare Risk at Home Any stairs in or around the home?: (Patient-Rptd) No If so, are there any without handrails?: Yes Home free of loose throw rugs in walkways, pet beds, electrical cords, etc?: (Patient-Rptd) Yes Adequate lighting in your home to reduce risk of falls?: (Patient-Rptd) Yes Life alert?: (Patient-Rptd) Yes Use of a cane, walker or w/c?: (Patient-Rptd) No Grab bars in the bathroom?: (Patient-Rptd) Yes Shower chair or bench in shower?: (Patient-Rptd) No Elevated  toilet seat or a handicapped toilet?: (Patient-Rptd) No  TIMED UP AND GO:  Was the test performed?  {[phone]}  Cognitive  Function: {CognitiveScreening:32337}    05/12/2017    9:52 AM  MMSE - Mini Mental State Exam  Orientation to time 5  Orientation to Place 5  Registration 3  Attention/ Calculation 5  Recall 3  Language- name 2 objects 2  Language- repeat 1  Language- follow 3 step command 3  Language- read & follow direction 1  Write a sentence 1  Copy design 1  Total score 30        11/21/2023    9:45 AM 11/18/2022    9:33 AM  6CIT Screen  What Year? 0 points 0 points  What month? 0 points 0 points  What time? 0 points 0 points  Count back from 20 0 points 0 points  Months in reverse 0 points 0 points  Repeat phrase 0 points 0 points  Total Score 0 points 0 points    Immunizations Immunization History  Administered Date(s) Administered   Fluad Quad(high Dose 65+) 04/05/2019, 04/15/2020   Influenza Split 05/17/2012   Influenza Whole 05/17/2012   Influenza, High Dose Seasonal PF 05/26/2015, 05/12/2016, 05/03/2017, 04/20/2021, 05/18/2022   Influenza,inj,Quad PF,6+ Mos 05/28/2013, 05/03/2014   Influenza-Unspecified 06/03/2018, 05/10/2023   Moderna Covid-19 Vaccine Bivalent Booster 28yrs & up 05/18/2022   Moderna Sars-Covid-2 Vaccination 05/10/2023   PFIZER(Purple Top)SARS-COV-2 Vaccination 08/20/2019, 09/09/2019, 05/04/2020, 01/15/2021   PNEUMOCOCCAL CONJUGATE-20 07/12/2021   Pfizer Covid-19 Vaccine Bivalent Booster 42yrs & up 05/04/2021   Pfizer(Comirnaty)Fall Seasonal Vaccine 12 years and older 05/10/2023   Pneumococcal Conjugate-13 10/29/2014   Pneumococcal Polysaccharide-23 04/28/2010   Respiratory Syncytial Virus Vaccine,Recomb Aduvanted(Arexvy) 05/05/2022   Td 02/14/2009   Tdap 03/04/2009, 07/28/2020, 01/28/2021   Zoster Recombinant(Shingrix) 12/05/2016, 03/15/2017   Zoster, Live 06/12/2006    Screening Tests Health Maintenance  Topic Date Due   COVID-19 Vaccine (8 - Pfizer risk 2024-25 season) 11/08/2023   INFLUENZA VACCINE  03/08/2024   Medicare Annual Wellness (AWV)   11/20/2024   DTaP/Tdap/Td (5 - Td or Tdap) 01/29/2031   Pneumonia Vaccine 105+ Years old  Completed   DEXA SCAN  Completed   Zoster Vaccines- Shingrix  Completed   HPV VACCINES  Aged Out   Meningococcal B Vaccine  Aged Out   Hepatitis C Screening  Discontinued    Health Maintenance  Health Maintenance Due  Topic Date Due   COVID-19 Vaccine (8 - Pfizer risk 2024-25 season) 11/08/2023   Health Maintenance Items Addressed: {HMMCR (Optional):30011}  Additional Screening:  Vision Screening: Recommended annual ophthalmology exams for early detection of glaucoma and other disorders of the eye.  Dental Screening: Recommended annual dental exams for proper oral hygiene  Community Resource Referral / Chronic Care Management: CRR required this visit?  {YES/NO:21197}  CCM required this visit?  {CCM Required [phone]}     Plan:     I have personally reviewed and noted the following in the patient's chart:   Medical and social history Use of alcohol, tobacco or illicit drugs  Current medications and supplements including opioid prescriptions. {Opioid [phone]} Functional ability and status Nutritional status Physical activity Advanced directives List of other physicians Hospitalizations, surgeries, and ER visits in previous 12 months Vitals Screenings to include cognitive, depression, and falls Referrals and appointments  In addition, I have reviewed and discussed with patient certain preventive protocols, quality metrics, and best practice recommendations. A written personalized care plan for preventive services as well as  general preventive health recommendations were provided to patient.     Freeda Jerry, [place]   11/21/2023   After Visit Summary: {CHL AMB AWV After Visit Summary:[phone]}  Notes: {Nurse Notes:32343}

## 2023-12-18 ENCOUNTER — Other Ambulatory Visit (INDEPENDENT_AMBULATORY_CARE_PROVIDER_SITE_OTHER)

## 2023-12-18 DIAGNOSIS — I10 Essential (primary) hypertension: Secondary | ICD-10-CM | POA: Diagnosis not present

## 2023-12-18 LAB — BASIC METABOLIC PANEL WITH GFR
BUN: 13 mg/dL (ref 6–23)
CO2: 26 meq/L (ref 19–32)
Calcium: 9.5 mg/dL (ref 8.4–10.5)
Chloride: 106 meq/L (ref 96–112)
Creatinine, Ser: 1.39 mg/dL — ABNORMAL HIGH (ref 0.40–1.20)
GFR: 35.02 mL/min — ABNORMAL LOW (ref >60.00)
Glucose, Bld: 121 mg/dL — ABNORMAL HIGH (ref 70–99)
Potassium: 4.8 meq/L (ref 3.5–5.1)
Sodium: 139 meq/L (ref 135–145)

## 2023-12-18 NOTE — Patient Instructions (Signed)
[id number] 

## 2023-12-20 ENCOUNTER — Ambulatory Visit: Payer: Self-pay | Admitting: Internal Medicine

## 2023-12-20 NOTE — Addendum Note (Signed)
 Addended by: Thersia Flax on: 12/20/2023 12:51 PM   Modules accepted: Orders

## 2024-01-23 ENCOUNTER — Other Ambulatory Visit: Payer: Self-pay | Admitting: Nephrology

## 2024-01-23 DIAGNOSIS — N1831 Chronic kidney disease, stage 3a: Secondary | ICD-10-CM | POA: Diagnosis not present

## 2024-01-23 DIAGNOSIS — R8290 Unspecified abnormal findings in urine: Secondary | ICD-10-CM

## 2024-01-23 DIAGNOSIS — E785 Hyperlipidemia, unspecified: Secondary | ICD-10-CM | POA: Diagnosis not present

## 2024-01-23 DIAGNOSIS — R809 Proteinuria, unspecified: Secondary | ICD-10-CM | POA: Diagnosis not present

## 2024-01-23 DIAGNOSIS — E875 Hyperkalemia: Secondary | ICD-10-CM | POA: Diagnosis not present

## 2024-01-23 DIAGNOSIS — I10 Essential (primary) hypertension: Secondary | ICD-10-CM | POA: Diagnosis not present

## 2024-01-29 ENCOUNTER — Telehealth: Payer: Self-pay

## 2024-01-29 NOTE — Telephone Encounter (Signed)
 Pt submitted some bp readings. I have placed them in your yellow results folder.

## 2024-01-30 ENCOUNTER — Ambulatory Visit
Admission: RE | Admit: 2024-01-30 | Discharge: 2024-01-30 | Disposition: A | Discharge status: Home / Self Care | Source: Ambulatory Visit | Attending: Nephrology | Admitting: Nephrology

## 2024-01-30 DIAGNOSIS — N281 Cyst of kidney, acquired: Secondary | ICD-10-CM | POA: Diagnosis not present

## 2024-01-30 DIAGNOSIS — N261 Atrophy of kidney (terminal): Secondary | ICD-10-CM | POA: Diagnosis not present

## 2024-01-30 DIAGNOSIS — R8290 Unspecified abnormal findings in urine: Secondary | ICD-10-CM | POA: Insufficient documentation

## 2024-01-30 DIAGNOSIS — N1831 Chronic kidney disease, stage 3a: Secondary | ICD-10-CM | POA: Insufficient documentation

## 2024-01-30 NOTE — Telephone Encounter (Signed)
 LMTCB

## 2024-01-30 NOTE — Telephone Encounter (Signed)
 Spoke with pt and scheduled her for 02/13/2024 at 9:30

## 2024-01-30 NOTE — Telephone Encounter (Unsigned)
 Copied from CRM [id number]. Topic: Appointments - Appointment Scheduling >> Jan 30, 2024  3:16 PM Zenovia PARAS wrote: Patient is calling back returning miss call, per notes pt is to schedule follow up appt. Tried to get scheduled but there nothing available until Aug. Would like another phone call to discuss

## 2024-01-31 NOTE — Telephone Encounter (Signed)
 Noted

## 2024-02-05 ENCOUNTER — Telehealth: Payer: Self-pay

## 2024-02-05 NOTE — Telephone Encounter (Signed)
 [facility] has faxed more bp readings. I have placed them in your yellow results folder for review.

## 2024-02-06 NOTE — Telephone Encounter (Signed)
 Pt is aware and gave a verbal understanding.

## 2024-02-07 DIAGNOSIS — E785 Hyperlipidemia, unspecified: Secondary | ICD-10-CM | POA: Diagnosis not present

## 2024-02-07 DIAGNOSIS — R8290 Unspecified abnormal findings in urine: Secondary | ICD-10-CM | POA: Diagnosis not present

## 2024-02-07 DIAGNOSIS — E875 Hyperkalemia: Secondary | ICD-10-CM | POA: Diagnosis not present

## 2024-02-07 DIAGNOSIS — R809 Proteinuria, unspecified: Secondary | ICD-10-CM | POA: Diagnosis not present

## 2024-02-07 DIAGNOSIS — N1831 Chronic kidney disease, stage 3a: Secondary | ICD-10-CM | POA: Diagnosis not present

## 2024-02-07 DIAGNOSIS — I10 Essential (primary) hypertension: Secondary | ICD-10-CM | POA: Diagnosis not present

## 2024-02-13 ENCOUNTER — Encounter: Payer: Self-pay | Admitting: Internal Medicine

## 2024-02-13 ENCOUNTER — Ambulatory Visit: Admitting: Internal Medicine

## 2024-02-13 VITALS — BP 136/70 | HR 63 | Ht 63.75 in | Wt 139.0 lb

## 2024-02-13 DIAGNOSIS — E538 Deficiency of other specified B group vitamins: Secondary | ICD-10-CM

## 2024-02-13 DIAGNOSIS — E875 Hyperkalemia: Secondary | ICD-10-CM | POA: Diagnosis not present

## 2024-02-13 DIAGNOSIS — I10 Essential (primary) hypertension: Secondary | ICD-10-CM

## 2024-02-13 DIAGNOSIS — T50905A Adverse effect of unspecified drugs, medicaments and biological substances, initial encounter: Secondary | ICD-10-CM

## 2024-02-13 DIAGNOSIS — E785 Hyperlipidemia, unspecified: Secondary | ICD-10-CM | POA: Diagnosis not present

## 2024-02-13 DIAGNOSIS — N1831 Chronic kidney disease, stage 3a: Secondary | ICD-10-CM | POA: Diagnosis not present

## 2024-02-13 DIAGNOSIS — E559 Vitamin D deficiency, unspecified: Secondary | ICD-10-CM | POA: Diagnosis not present

## 2024-02-13 NOTE — Patient Instructions (Addendum)
 Continue amlodipine  5 mg daily . Your bp is at goal (< 140/90)    I have made a  Referral to [facility] for  evaluation of the renal artery flow .  If you atre not contacted in 2 weeks,  let me know  I will make Dr Douglas aware of the results   I will see  you in December ,  not September.  You can get your labs done prior to visit

## 2024-02-13 NOTE — Assessment & Plan Note (Signed)
 After changing amlodipine  to losartan  for management of early microalbuminuria., she developed hyperkalemia and a decrease in GFR.  Continue amlodipine    .  [facility] referral made to rule out RAS

## 2024-02-13 NOTE — Assessment & Plan Note (Signed)
 Stable, seeing nephrology now.  Referring for renal artery doppler

## 2024-02-13 NOTE — Progress Notes (Signed)
 Subjective:  Patient ID: Maria Christensen, female    DOB: 16-May-1941  Age: 83 y.o. MRN: [id number]  CC: The primary encounter diagnosis was Drug-induced hyperkalemia. Diagnoses of Stage 3a chronic kidney disease (HCC), Essential hypertension, B12 deficiency, Vitamin D  deficiency, and Hyperlipidemia with target LDL less than 160 were also pertinent to this visit.   HPI TANIESHA GLANZ presents for  Chief Complaint  Patient presents with   Medical Management of Chronic Issues    Follow up on blood pressure    HTN:  currently taking amlodipine   5 mg daily . Home readings at [facility] have ranged from 118 to 129 systolic,  has had 2 episodes of orthostasis  CKD:  referred to Nephrology afer GFR dropped to 35 ml/min  workup is pending; she was  seen in June.  Has not had RAS ruled out but had trial of ARB with subsequent rise in Potassium and drop in GFR    HDL:  taking crestor  5 mg  with good tolerance.       Outpatient Medications Prior to Visit  Medication Sig Dispense Refill   acetaminophen (TYLENOL) 500 MG tablet Take 500 mg by mouth.     amLODipine  (NORVASC ) 5 MG tablet Take 1 tablet (5 mg total) by mouth daily. 90 tablet 3   brimonidine  (ALPHAGAN ) 0.2 % ophthalmic solution 1 drop 2 (two) times daily.     Cholecalciferol (VITAMIN D3) 50 MCG (2000 UT) capsule Take 2,000 Units by mouth daily.     dorzolamide-timolol  (COSOPT) 22.3-6.8 MG/ML ophthalmic solution Place 1 drop into both eyes 2 (two) times daily.      LUMIGAN 0.01 % SOLN Place 1 drop into both eyes at bedtime.      rosuvastatin  (CRESTOR ) 5 MG tablet Take 1 tablet (5 mg total) by mouth daily. 90 tablet 3   vitamin B-12 (CYANOCOBALAMIN ) 1000 MCG tablet Take 2,000 mcg by mouth daily.     brimonidine  (ALPHAGAN  P) 0.1 % SOLN Place 1 drop into both eyes 2 (two) times daily.     No facility-administered medications prior to visit.    Review of Systems;  Patient denies headache, fevers, malaise, unintentional weight loss,  skin rash, eye pain, sinus congestion and sinus pain, sore throat, dysphagia,  hemoptysis , cough, dyspnea, wheezing, chest pain, palpitations, orthopnea, edema, abdominal pain, nausea, melena, diarrhea, constipation, flank pain, dysuria, hematuria, urinary  Frequency, nocturia, numbness, tingling, seizures,  Focal weakness, Loss of consciousness,  Tremor, insomnia, depression, anxiety, and suicidal ideation.      Objective:  BP 136/70   Pulse 63   Ht 5' 3.75 (1.619 m)   Wt 139 lb (63 kg)   SpO2 97%   BMI 24.05 kg/m   BP Readings from Last 3 Encounters:  02/13/24 136/70  11/21/23 134/66  10/26/23 134/66    Wt Readings from Last 3 Encounters:  02/13/24 139 lb (63 kg)  11/21/23 137 lb (62.1 kg)  10/26/23 139 lb 9.6 oz (63.3 kg)    Physical Exam Vitals reviewed.  Constitutional:      General: She is not in acute distress.    Appearance: Normal appearance. She is normal weight. She is not ill-appearing, toxic-appearing or diaphoretic.  HENT:     Head: Normocephalic.  Eyes:     General: No scleral icterus.       Right eye: No discharge.        Left eye: No discharge.     Conjunctiva/sclera: Conjunctivae normal.  Cardiovascular:  Rate and Rhythm: Normal rate and regular rhythm.     Heart sounds: Normal heart sounds.  Pulmonary:     Effort: Pulmonary effort is normal. No respiratory distress.     Breath sounds: Normal breath sounds.  Musculoskeletal:        General: Normal range of motion.  Skin:    General: Skin is warm and dry.  Neurological:     General: No focal deficit present.     Mental Status: She is alert and oriented to person, place, and time. Mental status is at baseline.  Psychiatric:        Mood and Affect: Mood normal.        Behavior: Behavior normal.        Thought Content: Thought content normal.        Judgment: Judgment normal.     Lab Results  Component Value Date   HGBA1C 6.0 10/24/2023   HGBA1C 6.2 07/17/2023   HGBA1C 5.9 01/09/2023     Lab Results  Component Value Date   CREATININE 1.39 (H) 12/18/2023   CREATININE 1.22 (H) 11/15/2023   CREATININE 1.08 11/06/2023    Lab Results  Component Value Date   WBC 7.0 07/17/2023   HGB 15.4 (H) 07/17/2023   HCT 46.5 (H) 07/17/2023   PLT 204.0 07/17/2023   GLUCOSE 121 (H) 12/18/2023   CHOL 135 07/17/2023   TRIG 117.0 07/17/2023   HDL 48.20 07/17/2023   LDLDIRECT 61.0 07/08/2022   LDLCALC 63 07/17/2023   ALT 11 10/24/2023   AST 16 10/24/2023   NA 139 12/18/2023   K 4.8 12/18/2023   CL 106 12/18/2023   CREATININE 1.39 (H) 12/18/2023   BUN 13 12/18/2023   CO2 26 12/18/2023   TSH 1.69 07/08/2022   HGBA1C 6.0 10/24/2023   MICROALBUR 5.4 (H) 10/26/2023    US  RENAL Result Date: 02/06/2024 CLINICAL DATA:  Stage III A chronic renal disease. EXAM: RENAL / URINARY TRACT ULTRASOUND COMPLETE COMPARISON:  None Available. FINDINGS: Right Kidney: Renal measurements: 8.5 cm x 4.8 cm x 4.3 cm = volume: 92 mL. Echogenicity within normal limits. A 1.9 cm x 2.1 cm x 2.2 cm parapelvic renal cyst is seen. An extrarenal pelvis is also noted on the right. Left Kidney: Renal measurements: 9.9 cm x 4.2 cm x 4.6 cm = volume: 101 mL. Echogenicity within normal limits. Multiple parapelvic renal cysts are seen. The largest measures 3.5 cm x 1.5 cm x 2.9 cm. No hydronephrosis is visualized. Bladder: The urinary bladder is empty and subsequently limited in evaluation. Other: None. IMPRESSION: 1. Atrophic right kidney. 2. Bilateral parapelvic renal cysts. Electronically Signed   By: Suzen Dials M.D.   On: 02/06/2024 00:58    Assessment & Plan:  .Drug-induced hyperkalemia -     Ambulatory referral to Vascular Surgery -     Comprehensive metabolic panel with GFR; Future  Stage 3a chronic kidney disease (HCC) Assessment & Plan: Stable, seeing nephrology now.  Referring for renal artery doppler   Orders: -     Ambulatory referral to Vascular Surgery  Essential hypertension Assessment &  Plan: After changing amlodipine  to losartan  for management of early microalbuminuria., she developed hyperkalemia and a decrease in GFR.  Continue amlodipine    .  [facility] referral made to rule out RAS   Orders: -     Ambulatory referral to Vascular Surgery -     Lipid Panel w/reflex Direct LDL; Future  B12 deficiency -     B12 and  Folate Panel; Future  Vitamin D  deficiency -     VITAMIN D  25 Hydroxy (Vit-D Deficiency, Fractures); Future  Hyperlipidemia with target LDL less than 160 -     Lipid Panel w/reflex Direct LDL; Future      Follow-up: No follow-ups on file.   Verneita LITTIE Kettering, MD

## 2024-02-15 DIAGNOSIS — E875 Hyperkalemia: Secondary | ICD-10-CM | POA: Diagnosis not present

## 2024-02-15 DIAGNOSIS — N1831 Chronic kidney disease, stage 3a: Secondary | ICD-10-CM | POA: Diagnosis not present

## 2024-02-15 DIAGNOSIS — R809 Proteinuria, unspecified: Secondary | ICD-10-CM | POA: Diagnosis not present

## 2024-02-15 DIAGNOSIS — I10 Essential (primary) hypertension: Secondary | ICD-10-CM | POA: Diagnosis not present

## 2024-02-15 DIAGNOSIS — E785 Hyperlipidemia, unspecified: Secondary | ICD-10-CM | POA: Diagnosis not present

## 2024-02-17 ENCOUNTER — Encounter: Payer: Self-pay | Admitting: Internal Medicine

## 2024-02-19 NOTE — Addendum Note (Signed)
 Addended by: FROYLAN SOR on: 02/19/2024 02:02 PM   Modules accepted: Orders

## 2024-02-26 ENCOUNTER — Ambulatory Visit (INDEPENDENT_AMBULATORY_CARE_PROVIDER_SITE_OTHER): Payer: Self-pay | Admitting: Nurse Practitioner

## 2024-02-26 ENCOUNTER — Ambulatory Visit (INDEPENDENT_AMBULATORY_CARE_PROVIDER_SITE_OTHER)

## 2024-02-26 ENCOUNTER — Encounter (INDEPENDENT_AMBULATORY_CARE_PROVIDER_SITE_OTHER): Payer: Self-pay | Admitting: Nurse Practitioner

## 2024-02-26 ENCOUNTER — Other Ambulatory Visit (INDEPENDENT_AMBULATORY_CARE_PROVIDER_SITE_OTHER): Payer: Self-pay | Admitting: Nurse Practitioner

## 2024-02-26 VITALS — BP 157/84 | HR 69 | Ht 63.75 in | Wt 136.4 lb

## 2024-02-26 DIAGNOSIS — E875 Hyperkalemia: Secondary | ICD-10-CM

## 2024-02-26 DIAGNOSIS — I10 Essential (primary) hypertension: Secondary | ICD-10-CM

## 2024-02-26 DIAGNOSIS — I701 Atherosclerosis of renal artery: Secondary | ICD-10-CM

## 2024-02-26 DIAGNOSIS — N1831 Chronic kidney disease, stage 3a: Secondary | ICD-10-CM

## 2024-02-26 DIAGNOSIS — E785 Hyperlipidemia, unspecified: Secondary | ICD-10-CM | POA: Diagnosis not present

## 2024-02-27 ENCOUNTER — Encounter: Payer: Self-pay | Admitting: Internal Medicine

## 2024-02-27 DIAGNOSIS — H43813 Vitreous degeneration, bilateral: Secondary | ICD-10-CM | POA: Diagnosis not present

## 2024-02-27 DIAGNOSIS — H401122 Primary open-angle glaucoma, left eye, moderate stage: Secondary | ICD-10-CM | POA: Diagnosis not present

## 2024-02-27 DIAGNOSIS — Z961 Presence of intraocular lens: Secondary | ICD-10-CM | POA: Diagnosis not present

## 2024-02-27 DIAGNOSIS — H401113 Primary open-angle glaucoma, right eye, severe stage: Secondary | ICD-10-CM | POA: Diagnosis not present

## 2024-02-27 NOTE — Progress Notes (Signed)
 Subjective:    Patient ID: Maria Christensen, female    DOB: [birth year], 83 y.o.   MRN: [id number] Chief Complaint  Patient presents with   np. consult. history of hyperkalemia and Acute renal failur    The patient is an 83 year old female who presents today as a referral from Dr. Tullo with concern for renal artery stenosis.  The patient has had blood pressure which has been somewhat difficult to control.  She is also have worsening renal function.  In the light of this the patient's primary care provider felt it was prudent to evaluate for possible renal artery stenosis.  She had a previous renal duplex was noted multiple parapelvic cysts.  There may be had studies that shows no significant right renal artery stenosis but there are 1 to 59% stenosis of the left renal artery.  Multiple parapelvic cysts are seen as consistent with her previous ultrasound.  Her right kidney is smaller and atrophic.    Review of Systems  Cardiovascular:  Negative for leg swelling.  All other systems reviewed and are negative.      Objective:   Physical Exam Vitals reviewed.  HENT:     Head: Normocephalic.  Cardiovascular:     Rate and Rhythm: Normal rate.     Pulses: Normal pulses.  Pulmonary:     Effort: Pulmonary effort is normal.  Skin:    General: Skin is warm and dry.  Neurological:     Mental Status: She is alert and oriented to person, place, and time.  Psychiatric:        Mood and Affect: Mood normal.        Behavior: Behavior normal.        Thought Content: Thought content normal.        Judgment: Judgment normal.     BP (!) 157/84   Pulse 69   Ht 5' 3.75 (1.619 m)   Wt 136 lb 6 oz (61.9 kg)   BMI 23.59 kg/m   Past Medical History:  Diagnosis Date   Cerebral hemorrhage (HCC) 2005   Cleft palate    Glaucoma    managed b [place],  bilateral   Herpes zoster    Mitral valve prolapse    2005 no deficits   Open-angle glaucoma    MANAGED BY DR [facility]     Osteopenia    PONV (postoperative nausea and vomiting)    Wears hearing aid in right ear     Social History   Socioeconomic History   Marital status: Widowed    Spouse name: Not on file   Number of children: Not on file   Years of education: Not on file   Highest education level: Bachelor's degree (e.g., BA, AB, BS)  Occupational History   Occupation: retired  Tobacco Use   Smoking status: Former    Current packs/day: 0.00    Types: Cigarettes    Quit date: 11/15/1961    Years since quitting: 62.3   Smokeless tobacco: Never  Vaping Use   Vaping status: Never Used  Substance and Sexual Activity   Alcohol use: No   Drug use: No   Sexual activity: Not on file  Other Topics Concern   Not on file  Social History Narrative   Lives alone   Social Drivers of Health   Financial Resource Strain: Low Risk  (02/09/2024)   Overall Financial Resource Strain (CARDIA)    Difficulty of Paying Living Expenses: Not hard at all  Food Insecurity: No Food Insecurity (02/09/2024)   Hunger Vital Sign    Worried About Running Out of Food in the Last Year: Never true    Ran Out of Food in the Last Year: Never true  Transportation Needs: No Transportation Needs (02/09/2024)   PRAPARE - Administrator, Civil Service (Medical): No    Lack of Transportation (Non-Medical): No  Physical Activity: Sufficiently Active (02/09/2024)   Exercise Vital Sign    Days of Exercise per Week: 7 days    Minutes of Exercise per Session: 40 min  Stress: No Stress Concern Present (02/09/2024)   [employer] of Occupational Health - Occupational Stress Questionnaire    Feeling of Stress: Not at all  Social Connections: Moderately Isolated (02/09/2024)   Social Connection and Isolation Panel    Frequency of Communication with Friends and Family: Three times a week    Frequency of Social Gatherings with Friends and Family: More than three times a week    Attends Religious Services: More than 4 times per  year    Active Member of [employer] or Organizations: No    Attends Banker Meetings: Not on file    Marital Status: Widowed  Intimate Partner Violence: Not At Risk (11/21/2023)   Humiliation, Afraid, Rape, and Kick questionnaire    Fear of Current or Ex-Partner: No    Emotionally Abused: No    Physically Abused: No    Sexually Abused: No    Past Surgical History:  Procedure Laterality Date   ABDOMINAL HYSTERECTOMY     BUNIONECTOMY  2002   CATARACT EXTRACTION W/PHACO Left 04/01/2015   Procedure: CATARACT EXTRACTION PHACO AND INTRAOCULAR LENS PLACEMENT (IOC);  Surgeon: Dene Etienne, MD;  Location: [facility];  Service: Ophthalmology;  Laterality: Left;   CATARACT EXTRACTION W/PHACO Right 12/16/2020   Procedure: CATARACT EXTRACTION PHACO AND INTRAOCULAR LENS PLACEMENT (IOC) RIGHT KAHOOK DUAL BLADE GONIOTOMY IV ZOFRAN  5.11 00:56.2 9.1%;  Surgeon: Etienne Dene, MD;  Location: [facility];  Service: Ophthalmology;  Laterality: Right;   CLEFT PALATE REPAIR      Family History  Problem Relation Age of Onset   Osteoporosis Mother    Cancer Father        colon   Heart attack Brother    Diabetes Brother    Glaucoma Brother 64       small cell lung CA   Hypertension Brother     Allergies  Allergen Reactions   Losartan  Other (See Comments)    Hyperkalemia, decreased GFR         Latest Ref Rng & Units 07/17/2023    7:36 AM 07/08/2022    7:58 AM 07/13/2020    1:54 PM  CBC  WBC 4.0 - 10.5 K/uL 7.0  5.7  6.1   Hemoglobin 12.0 - 15.0 g/dL 84.5  85.1  84.6   Hematocrit 36.0 - 46.0 % 46.5  43.0  45.6   Platelets 150.0 - 400.0 K/uL 204.0  176.0  191.0       CMP     Component Value Date/Time   NA 139 12/18/2023 0849   NA 143 08/27/2010 0810   K 4.8 12/18/2023 0849   CL 106 12/18/2023 0849   CO2 26 12/18/2023 0849   GLUCOSE 121 (H) 12/18/2023 0849   BUN 13 12/18/2023 0849   BUN 16 08/27/2010 0810   CREATININE 1.39 (H) 12/18/2023 0849    CALCIUM  9.5 12/18/2023 0849   PROT 6.6 10/24/2023 0856  ALBUMIN 4.3 10/24/2023 0856   AST 16 10/24/2023 0856   ALT 11 10/24/2023 0856   ALKPHOS 59 10/24/2023 0856   BILITOT 1.8 (H) 10/24/2023 0856   GFR 35.02 (L) 12/18/2023 0849     No results found.     Assessment & Plan:   1. Renal artery stenosis (HCC) (Primary) Today noninvasive studies note an atrophic right kidney with a normal-sized left kidney.  There is no significant stenosis in the right renal artery but a 1 to 59% stenosis of the left renal artery.  Based on velocities I suspect this is likely less than 30%.  I do not believe that renal artery stenosis is causing a significant deficit in her renal function or hypertension.  In addition any additional testing, such as an angiogram would require use of contrast dye which will likely further worsen her chronic kidney disease.  At this time I do not feel that the risk would outweigh the benefits of an angiogram.  We will continue to follow the patient however have her return in 6 months with a renal artery duplex.  2. Essential hypertension Continue antihypertensive medications as already ordered, these medications have been reviewed and there are no changes at this time.  3. Hyperlipidemia with target LDL less than 160 Continue statin as ordered and reviewed, no changes at this time   Current Outpatient Medications on File Prior to Visit  Medication Sig Dispense Refill   acetaminophen (TYLENOL) 500 MG tablet Take 500 mg by mouth every 6 (six) hours as needed.     amLODipine  (NORVASC ) 5 MG tablet Take 1 tablet (5 mg total) by mouth daily. 90 tablet 3   brimonidine  (ALPHAGAN ) 0.2 % ophthalmic solution Place 1 drop into both eyes 2 (two) times daily.     Cholecalciferol (VITAMIN D3) 50 MCG (2000 UT) capsule Take 2,000 Units by mouth daily.     Cyanocobalamin  (VITAMIN B-12) 1000 MCG SUBL Place 1,000 mcg under the tongue daily. Changes from 1000 sublingual to 2000 PO      dorzolamide-timolol  (COSOPT) 22.3-6.8 MG/ML ophthalmic solution Place 1 drop into both eyes 2 (two) times daily.      LUMIGAN 0.01 % SOLN Place 1 drop into both eyes at bedtime.      rosuvastatin  (CRESTOR ) 5 MG tablet Take 1 tablet (5 mg total) by mouth daily. 90 tablet 3   No current facility-administered medications on file prior to visit.    There are no Patient Instructions on file for this visit. No follow-ups on file.   Tameah Mihalko E Raeana Blinn, NP

## 2024-02-28 ENCOUNTER — Encounter: Payer: Self-pay | Admitting: Internal Medicine

## 2024-04-24 DIAGNOSIS — H6123 Impacted cerumen, bilateral: Secondary | ICD-10-CM | POA: Diagnosis not present

## 2024-04-24 DIAGNOSIS — H903 Sensorineural hearing loss, bilateral: Secondary | ICD-10-CM | POA: Diagnosis not present

## 2024-04-29 ENCOUNTER — Ambulatory Visit: Admitting: Internal Medicine

## 2024-05-04 ENCOUNTER — Encounter: Payer: Self-pay | Admitting: Internal Medicine

## 2024-07-15 ENCOUNTER — Encounter: Payer: Self-pay | Admitting: *Deleted

## 2024-07-16 ENCOUNTER — Other Ambulatory Visit

## 2024-07-16 DIAGNOSIS — E538 Deficiency of other specified B group vitamins: Secondary | ICD-10-CM | POA: Diagnosis not present

## 2024-07-16 DIAGNOSIS — E785 Hyperlipidemia, unspecified: Secondary | ICD-10-CM

## 2024-07-16 DIAGNOSIS — E559 Vitamin D deficiency, unspecified: Secondary | ICD-10-CM

## 2024-07-16 DIAGNOSIS — I10 Essential (primary) hypertension: Secondary | ICD-10-CM

## 2024-07-16 DIAGNOSIS — E875 Hyperkalemia: Secondary | ICD-10-CM

## 2024-07-16 DIAGNOSIS — T50905A Adverse effect of unspecified drugs, medicaments and biological substances, initial encounter: Secondary | ICD-10-CM | POA: Diagnosis not present

## 2024-07-16 LAB — COMPREHENSIVE METABOLIC PANEL WITH GFR
ALT: 8 U/L (ref 0–35)
AST: 14 U/L (ref 0–37)
Albumin: 4.4 g/dL (ref 3.5–5.2)
Alkaline Phosphatase: 68 U/L (ref 39–117)
BUN: 18 mg/dL (ref 6–23)
CO2: 24 meq/L (ref 19–32)
Calcium: 9.6 mg/dL (ref 8.4–10.5)
Chloride: 107 meq/L (ref 96–112)
Creatinine, Ser: 1.01 mg/dL (ref 0.40–1.20)
GFR: 51.16 mL/min — ABNORMAL LOW (ref >60.00)
Glucose, Bld: 88 mg/dL (ref 70–99)
Potassium: 4.8 meq/L (ref 3.5–5.1)
Sodium: 139 meq/L (ref 135–145)
Total Bilirubin: 2.1 mg/dL — ABNORMAL HIGH (ref 0.2–1.2)
Total Protein: 6.9 g/dL (ref 6.0–8.3)

## 2024-07-16 LAB — VITAMIN D 25 HYDROXY (VIT D DEFICIENCY, FRACTURES): VITD: 60.02 ng/mL (ref 30.00–100.00)

## 2024-07-16 LAB — B12 AND FOLATE PANEL
Folate: 14 ng/mL (ref >5.9)
Vitamin B-12: 1288 pg/mL — ABNORMAL HIGH (ref 211–911)

## 2024-07-17 LAB — LIPID PANEL W/REFLEX DIRECT LDL
Cholesterol: 138 mg/dL (ref <200)
HDL: 61 mg/dL (ref >=50)
LDL Cholesterol (Calc): 58 mg/dL
Non-HDL Cholesterol (Calc): 77 mg/dL (ref <130)
Total CHOL/HDL Ratio: 2.3 (calc) (ref <5.0)
Triglycerides: 102 mg/dL (ref <150)

## 2024-07-18 ENCOUNTER — Encounter: Payer: Self-pay | Admitting: Internal Medicine

## 2024-07-18 ENCOUNTER — Ambulatory Visit: Admitting: Internal Medicine

## 2024-07-18 VITALS — BP 134/66 | HR 76 | Ht 63.75 in | Wt 135.2 lb

## 2024-07-18 DIAGNOSIS — E875 Hyperkalemia: Secondary | ICD-10-CM

## 2024-07-18 DIAGNOSIS — R351 Nocturia: Secondary | ICD-10-CM

## 2024-07-18 DIAGNOSIS — N1831 Chronic kidney disease, stage 3a: Secondary | ICD-10-CM

## 2024-07-18 DIAGNOSIS — R7303 Prediabetes: Secondary | ICD-10-CM

## 2024-07-18 DIAGNOSIS — I10 Essential (primary) hypertension: Secondary | ICD-10-CM

## 2024-07-18 DIAGNOSIS — E785 Hyperlipidemia, unspecified: Secondary | ICD-10-CM

## 2024-07-18 MED ORDER — ROSUVASTATIN CALCIUM 5 MG PO TABS: 5.0000 mg | ORAL_TABLET | Freq: Every day | ORAL | 3 refills | Status: AC

## 2024-07-18 MED ORDER — AMLODIPINE BESYLATE 5 MG PO TABS: 5.0000 mg | ORAL_TABLET | Freq: Every day | ORAL | 3 refills | Status: AC

## 2024-07-18 NOTE — Patient Instructions (Addendum)
 Your kidney function has improved compared to  the last time we checked it in May Your cholesterol is excellent.  Continue rosuvastatin   Your blood pressure is well controlled,  no medication changes are needed   If you want to try a medication for overactive bladder, (so you can sleep better) ,it appears that your insurance will cover  Vesicare Detrol And  Oxybutynin  The most common side effects are dizzines and dry mouth   Return in 6 months;  we will repeat non fasting labs before your visit

## 2024-07-18 NOTE — Assessment & Plan Note (Signed)
 Workup for secondary causes includes July 2025 rule out of significant RAS by [facility]

## 2024-07-18 NOTE — Assessment & Plan Note (Signed)
 Reviewed nephrology eval July 2025:  no med changes recommended . BP is controlled and she is avoidng NSAIDs

## 2024-07-18 NOTE — Progress Notes (Signed)
 Subjective:  Patient ID: Maria Christensen Generous, female    DOB: 1940-11-03  Age: 83 y.o. MRN: [id number]  CC: The primary encounter diagnosis was Essential hypertension. Diagnoses of Hyperlipidemia with target LDL less than 160, Stage 3a chronic kidney disease (HCC), Prediabetes, Drug-induced hyperkalemia, and Nocturia more than twice per night were also pertinent to this visit.   HPI CIMONE FAHEY presents for  Chief Complaint  Patient presents with   Medical Management of Chronic Issues    6 month follow up     1) Hypertension: patient checks blood pressure twice weekly at home.  Readings have been for the most part <130/80 at rest . Patient is following a reduced salt diet most days and is taking medications as prescribed   2) HLD:  taking 5 mg rosuvastatin  daily.  Denies myalgiAS, NAUSEA   3) CKD:   seeing [place] annually.   Avoiding NSAIDs.   4) Frequent wakings , chronic:  has nocturia 2 -4 times per night despite restricting water  intake by evening.     Outpatient Medications Prior to Visit  Medication Sig Dispense Refill   acetaminophen (TYLENOL) 500 MG tablet Take 500 mg by mouth every 6 (six) hours as needed.     brimonidine  (ALPHAGAN ) 0.2 % ophthalmic solution Place 1 drop into both eyes 2 (two) times daily.     Cholecalciferol (VITAMIN D3) 50 MCG (2000 UT) capsule Take 2,000 Units by mouth daily.     Cyanocobalamin  (VITAMIN B-12) 1000 MCG SUBL take 2000 mcg. daily 30 min. before noon  meal on empty stomach or if sublingual 1000 mcg. daily anytime     dorzolamide-timolol  (COSOPT) 22.3-6.8 MG/ML ophthalmic solution Place 1 drop into both eyes 2 (two) times daily.      LUMIGAN 0.01 % SOLN Place 1 drop into both eyes at bedtime.      amLODipine  (NORVASC ) 5 MG tablet Take 1 tablet (5 mg total) by mouth daily. 90 tablet 3   rosuvastatin  (CRESTOR ) 5 MG tablet Take 1 tablet (5 mg total) by mouth daily. 90 tablet 3   No facility-administered medications prior to visit.     Review of Systems;  Patient denies headache, fevers, malaise, unintentional weight loss, skin rash, eye pain, sinus congestion and sinus pain, sore throat, dysphagia,  hemoptysis , cough, dyspnea, wheezing, chest pain, palpitations, orthopnea, edema, abdominal pain, nausea, melena, diarrhea, constipation, flank pain, dysuria, hematuria, urinary  Frequency, nocturia, numbness, tingling, seizures,  Focal weakness, Loss of consciousness,  Tremor, insomnia, depression, anxiety, and suicidal ideation.      Objective:  BP 134/66   Pulse 76   Ht 5' 3.75 (1.619 m)   Wt 135 lb 3.2 oz (61.3 kg)   SpO2 96%   BMI 23.39 kg/m   BP Readings from Last 3 Encounters:  07/18/24 134/66  02/26/24 (!) 157/84  02/13/24 136/70    Wt Readings from Last 3 Encounters:  07/18/24 135 lb 3.2 oz (61.3 kg)  02/26/24 136 lb 6 oz (61.9 kg)  02/13/24 139 lb (63 kg)    Physical Exam Vitals reviewed.  Constitutional:      General: She is not in acute distress.    Appearance: Normal appearance. She is normal weight. She is not ill-appearing, toxic-appearing or diaphoretic.  HENT:     Head: Normocephalic.  Eyes:     General: No scleral icterus.       Right eye: No discharge.        Left eye: No discharge.  Conjunctiva/sclera: Conjunctivae normal.  Cardiovascular:     Rate and Rhythm: Normal rate and regular rhythm.     Heart sounds: Normal heart sounds.  Pulmonary:     Effort: Pulmonary effort is normal. No respiratory distress.     Breath sounds: Normal breath sounds.  Musculoskeletal:        General: Normal range of motion.  Skin:    General: Skin is warm and dry.  Neurological:     General: No focal deficit present.     Mental Status: She is alert and oriented to person, place, and time. Mental status is at baseline.  Psychiatric:        Mood and Affect: Mood normal.        Behavior: Behavior normal.        Thought Content: Thought content normal.        Judgment: Judgment normal.      Lab Results  Component Value Date   HGBA1C 6.0 10/24/2023   HGBA1C 6.2 07/17/2023   HGBA1C 5.9 01/09/2023    Lab Results  Component Value Date   CREATININE 1.01 07/16/2024   CREATININE 1.39 (H) 12/18/2023   CREATININE 1.22 (H) 11/15/2023    Lab Results  Component Value Date   WBC 7.0 07/17/2023   HGB 15.4 (H) 07/17/2023   HCT 46.5 (H) 07/17/2023   PLT 204.0 07/17/2023   GLUCOSE 88 07/16/2024   CHOL 138 07/16/2024   TRIG 102 07/16/2024   HDL 61 07/16/2024   LDLDIRECT 61.0 07/08/2022   LDLCALC 58 07/16/2024   ALT 8 07/16/2024   AST 14 07/16/2024   NA 139 07/16/2024   K 4.8 07/16/2024   CL 107 07/16/2024   CREATININE 1.01 07/16/2024   BUN 18 07/16/2024   CO2 24 07/16/2024   TSH 1.69 07/08/2022   HGBA1C 6.0 10/24/2023   MICROALBUR 5.4 (H) 10/26/2023    US  RENAL Result Date: 02/06/2024 CLINICAL DATA:  Stage III A chronic renal disease. EXAM: RENAL / URINARY TRACT ULTRASOUND COMPLETE COMPARISON:  None Available. FINDINGS: Right Kidney: Renal measurements: 8.5 cm x 4.8 cm x 4.3 cm = volume: 92 mL. Echogenicity within normal limits. A 1.9 cm x 2.1 cm x 2.2 cm parapelvic renal cyst is seen. An extrarenal pelvis is also noted on the right. Left Kidney: Renal measurements: 9.9 cm x 4.2 cm x 4.6 cm = volume: 101 mL. Echogenicity within normal limits. Multiple parapelvic renal cysts are seen. The largest measures 3.5 cm x 1.5 cm x 2.9 cm. No hydronephrosis is visualized. Bladder: The urinary bladder is empty and subsequently limited in evaluation. Other: None. IMPRESSION: 1. Atrophic right kidney. 2. Bilateral parapelvic renal cysts. Electronically Signed   By: Suzen Dials M.D.   On: 02/06/2024 00:58    Assessment & Plan:  .Essential hypertension Assessment & Plan: Workup for secondary causes includes July 2025 rule out of significant RAS by [facility]     Hyperlipidemia with target LDL less than 160 Assessment & Plan: LDL is at goal on lowest dose of rosuvastatin ..  no  changes today   Lab Results  Component Value Date   CHOL 138 07/16/2024   HDL 61 07/16/2024   LDLCALC 58 07/16/2024   LDLDIRECT 61.0 07/08/2022   TRIG 102 07/16/2024   CHOLHDL 2.3 07/16/2024   Lab Results  Component Value Date   ALT 8 07/16/2024   AST 14 07/16/2024   ALKPHOS 68 07/16/2024   BILITOT 2.1 (H) 07/16/2024     Orders: -  Rosuvastatin  Calcium ; Take 1 tablet (5 mg total) by mouth daily.  Dispense: 90 tablet; Refill: 3  Stage 3a chronic kidney disease [facility]) Assessment & Plan: Reviewed nephrology eval July 2025:  no med changes recommended . BP is controlled and she is avoidng NSAIDs   Orders: -     Comprehensive metabolic panel with GFR; Future  Prediabetes -     Hemoglobin A1c; Future  Drug-induced hyperkalemia Assessment & Plan: Occurred with change in antihypertensive from amlodipine  to losartan . RESOLVED WITH DC LOSARTAN   Lab Results  Component Value Date   NA 139 07/16/2024   K 4.8 07/16/2024   CL 107 07/16/2024   CO2 24 07/16/2024      Nocturia more than twice per night Assessment & Plan: CHRONIC,   Advised to reduce her nighttime fluid intake    Other orders -     amLODIPine  Besylate; Take 1 tablet (5 mg total) by mouth daily.  Dispense: 90 tablet; Refill: 3      Follow-up: Return in about 6 months (around 01/16/2025) for physical.   Verneita LITTIE Kettering, MD

## 2024-07-19 ENCOUNTER — Ambulatory Visit: Payer: Self-pay | Admitting: Internal Medicine

## 2024-07-20 NOTE — Assessment & Plan Note (Signed)
 CHRONIC,   Advised to reduce her nighttime fluid intake

## 2024-07-20 NOTE — Assessment & Plan Note (Signed)
 LDL is at goal on lowest dose of rosuvastatin ..  no changes today   Lab Results  Component Value Date   CHOL 138 07/16/2024   HDL 61 07/16/2024   LDLCALC 58 07/16/2024   LDLDIRECT 61.0 07/08/2022   TRIG 102 07/16/2024   CHOLHDL 2.3 07/16/2024   Lab Results  Component Value Date   ALT 8 07/16/2024   AST 14 07/16/2024   ALKPHOS 68 07/16/2024   BILITOT 2.1 (H) 07/16/2024

## 2024-07-20 NOTE — Assessment & Plan Note (Signed)
 Occurred with change in antihypertensive from amlodipine  to losartan . RESOLVED WITH DC LOSARTAN   Lab Results  Component Value Date   NA 139 07/16/2024   K 4.8 07/16/2024   CL 107 07/16/2024   CO2 24 07/16/2024

## 2024-08-26 ENCOUNTER — Other Ambulatory Visit (INDEPENDENT_AMBULATORY_CARE_PROVIDER_SITE_OTHER): Payer: Self-pay | Admitting: Nurse Practitioner

## 2024-08-26 DIAGNOSIS — I701 Atherosclerosis of renal artery: Secondary | ICD-10-CM

## 2024-08-29 ENCOUNTER — Ambulatory Visit (INDEPENDENT_AMBULATORY_CARE_PROVIDER_SITE_OTHER): Admitting: Nurse Practitioner

## 2024-08-29 ENCOUNTER — Ambulatory Visit (INDEPENDENT_AMBULATORY_CARE_PROVIDER_SITE_OTHER)

## 2024-08-29 ENCOUNTER — Encounter (INDEPENDENT_AMBULATORY_CARE_PROVIDER_SITE_OTHER): Payer: Self-pay | Admitting: Nurse Practitioner

## 2024-08-29 VITALS — BP 139/75 | HR 76 | Resp 17 | Ht 63.0 in | Wt 139.4 lb

## 2024-08-29 DIAGNOSIS — I10 Essential (primary) hypertension: Secondary | ICD-10-CM

## 2024-08-29 DIAGNOSIS — N281 Cyst of kidney, acquired: Secondary | ICD-10-CM | POA: Diagnosis not present

## 2024-08-29 DIAGNOSIS — E785 Hyperlipidemia, unspecified: Secondary | ICD-10-CM | POA: Diagnosis not present

## 2024-08-29 DIAGNOSIS — I701 Atherosclerosis of renal artery: Secondary | ICD-10-CM

## 2024-09-01 ENCOUNTER — Encounter (INDEPENDENT_AMBULATORY_CARE_PROVIDER_SITE_OTHER): Payer: Self-pay | Admitting: Nurse Practitioner

## 2024-09-01 NOTE — Progress Notes (Signed)
 "  Subjective:    Patient ID: Maria Christensen, female    DOB: 07/12/1941, 84 y.o.   MRN: [id number] Chief Complaint  Patient presents with   Follow-up    6 months + Renal    HPI  Discussed the use of AI scribe software for clinical note transcription with the patient, who gave verbal consent to proceed.  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  She has bilateral renal artery stenosis, with recent renal ultrasound demonstrating stable findings. Both renal arteries are estimated to have less than 30% narrowing, remaining within the 1-59% stenosis bracket. Her right kidney is smaller than the left, currently measuring 8.24 cm, decreased from 8.9 cm previously, consistent with atrophy. She has bilateral renal cysts, with the right-sided cyst increasing from 1.6 cm to 1.9 cm since the last measurement. She has not experienced symptoms attributable to her renal disease. She notes her urine has been lighter in color recently.  Her blood pressure has been well controlled, typically in the high 120s mmHg systolic, with a recent reading of 139 mmHg on the day of the visit, which she considers higher than her usual. She has not had blood pressure readings above 140 mmHg. She monitors her blood pressure at home weekly and sends results to her other provider. She is currently taking amlodipine  5 mg daily. She denies significant ankle edema, stating it occurs infrequently and is not bothersome. She inquires about the safety of drinking one cup of decaffeinated coffee daily and has not reported any adverse effects.    Results Diagnostic Renal ultrasound (07/2024): Bilateral renal artery stenosis less than 30% on both sides, both within 1-59% bracket; right kidney smaller than left, right kidney length 8.24 cm (previously 8.9 cm); right renal cyst increased from 1.6  cm to 1.9 cm; bilateral renal cysts, sizes relatively similar, no significant interval change.   Review of Systems     Objective:   Physical Exam  Physical Exam VITALS: BP- 139/  BP 139/75   Pulse 76   Resp 17   Ht 5' 3 (1.6 m)   Wt 139 lb 6.4 oz (63.2 kg)   BMI 24.69 kg/m   Past Medical History:  Diagnosis Date   Cerebral hemorrhage (HCC) 2005   Cleft palate    Glaucoma    managed b [place],  bilateral   Herpes zoster    Mitral valve prolapse    2005 no deficits   Open-angle glaucoma    MANAGED BY DR [facility]    Osteopenia    PONV (postoperative nausea and vomiting)    Wears hearing aid in right ear     Social History   Socioeconomic History   Marital status: Widowed    Spouse name: Not on file   Number of children: Not on file   Years of education: Not on file   Highest education level: Bachelor's degree (e.g., BA, AB, BS)  Occupational History   Occupation: retired  Tobacco Use   Smoking status: Former    Current packs/day: 0.00    Types: Cigarettes    Quit date: 11/15/1961    Years since quitting: 62.8   Smokeless tobacco: Never  Vaping Use   Vaping status: Never Used  Substance and Sexual Activity   Alcohol use: No   Drug use: No   Sexual activity: Not on file  Other Topics Concern   Not on file  Social History Narrative   Lives alone   Social Drivers of Health   Tobacco Use: Medium Risk (09/01/2024)   Patient History    Smoking Tobacco Use: Former    Smokeless Tobacco Use: Never    Passive Exposure: Not on file  Financial Resource Strain: Low Risk (07/14/2024)   Overall Financial Resource Strain (CARDIA)    Difficulty of Paying Living Expenses: Not hard at all  Food Insecurity: No Food Insecurity (07/14/2024)   Epic    Worried About Programme Researcher, Broadcasting/film/video in the Last Year: Never true    Ran Out of Food in the Last Year: Never true  Transportation Needs: No Transportation Needs (07/14/2024)   Epic    Lack of  Transportation (Medical): No    Lack of Transportation (Non-Medical): No  Physical Activity: Sufficiently Active (07/14/2024)   Exercise Vital Sign    Days of Exercise per Week: 6 days    Minutes of Exercise per Session: 30 min  Stress: No Stress Concern Present (07/14/2024)   [employer] of Occupational Health - Occupational Stress Questionnaire    Feeling of Stress: Not at all  Social Connections: Moderately Isolated (07/14/2024)   Social Connection and Isolation Panel    Frequency of Communication with Friends and Family: More than three times a week    Frequency of Social Gatherings with Friends and Family: More than three times a week    Attends Religious Services: More than 4 times per year    Active Member of [employer] or Organizations: No    Attends Banker Meetings: Not on file    Marital Status: Widowed  Intimate Partner Violence: Not At Risk (11/21/2023)   Humiliation, Afraid, Rape, and Kick questionnaire    Fear of Current or Ex-Partner: No    Emotionally Abused: No    Physically Abused: No    Sexually Abused: No  Depression (PHQ2-9): Low Risk (07/18/2024)   Depression (PHQ2-9)    PHQ-2 Score: 0  Alcohol Screen: Low Risk (11/21/2023)   Alcohol Screen    Last Alcohol Screening Score (AUDIT): 0  Housing: Low Risk (07/14/2024)   Epic    Unable to Pay for Housing in the Last Year: No    Number of Times Moved in the Last Year: 0    Homeless in the Last Year: No  Utilities: Not At Risk (11/21/2023)   A[employer] Utilities    Threatened with loss of utilities: No  Health Literacy: Not on file    Past Surgical History:  Procedure Laterality Date   ABDOMINAL HYSTERECTOMY     BUNIONECTOMY  2002   CATARACT EXTRACTION W/PHACO Left 04/01/2015   Procedure: CATARACT EXTRACTION PHACO AND INTRAOCULAR LENS PLACEMENT (IOC);  Surgeon: Dene Etienne, MD;  Location: [facility];  Service: Ophthalmology;  Laterality: Left;   CATARACT EXTRACTION W/PHACO Right 12/16/2020    Procedure: CATARACT EXTRACTION PHACO AND INTRAOCULAR LENS PLACEMENT (IOC) RIGHT KAHOOK DUAL BLADE GONIOTOMY IV ZOFRAN  5.11 00:56.2 9.1%;  Surgeon: Etienne Dene, MD;  Location: [facility];  Service: Ophthalmology;  Laterality: Right;   CLEFT PALATE REPAIR      Family History  Problem Relation Age of Onset   Osteoporosis Mother    Cancer Father        colon   Heart attack Brother    Diabetes Brother    Glaucoma Brother 21       small cell lung CA   Hypertension Brother  Allergies[1]     Latest Ref Rng & Units 07/17/2023    7:36 AM 07/08/2022    7:58 AM 07/13/2020    1:54 PM  CBC  WBC 4.0 - 10.5 K/uL 7.0  5.7  6.1   Hemoglobin 12.0 - 15.0 g/dL 84.5  85.1  84.6   Hematocrit 36.0 - 46.0 % 46.5  43.0  45.6   Platelets 150.0 - 400.0 K/uL 204.0  176.0  191.0       CMP     Component Value Date/Time   NA 139 07/16/2024 1059   NA 143 08/27/2010 0810   K 4.8 07/16/2024 1059   CL 107 07/16/2024 1059   CO2 24 07/16/2024 1059   GLUCOSE 88 07/16/2024 1059   BUN 18 07/16/2024 1059   BUN 16 08/27/2010 0810   CREATININE 1.01 07/16/2024 1059   CALCIUM  9.6 07/16/2024 1059   PROT 6.9 07/16/2024 1059   ALBUMIN 4.4 07/16/2024 1059   AST 14 07/16/2024 1059   ALT 8 07/16/2024 1059   ALKPHOS 68 07/16/2024 1059   BILITOT 2.1 (H) 07/16/2024 1059   GFR 51.16 (L) 07/16/2024 1059     No results found.     Assessment & Plan:   1. Renal artery stenosis (Primary) Renal artery stenosis Chronic bilateral renal artery stenosis with <30% narrowing, stable without intervention need. Hypertension controlled on low-dose amlodipine . Intervention not indicated unless stenosis >70% or hypertension refractory. - Reviewed recent renal ultrasound showing stable stenosis. - Recommended annual renal ultrasound unless hypertension uncontrolled or medication adjustments needed. - Advised continuation of amlodipine  5 mg daily. - VAS US  RENAL ARTERY DUPLEX; Future   2. Essential  hypertension Continue antihypertensive medications as already ordered, these medications have been reviewed and there are no changes at this time.  3. Hyperlipidemia with target LDL less than 160 Continue statin as ordered and reviewed, no changes at this time  4. Acquired renal cyst Acquired renal cysts Bilateral renal cysts asymptomatic with minor growth in right cyst from 1.6 cm to 1.9 cm. No complications or concerning features. - Reviewed renal ultrasound showing stable bilateral cysts with minor growth on right. - Recommended continued observation with annual imaging.     Medications Ordered Prior to Encounter[2]  There are no Patient Instructions on file for this visit. Return in about 1 year (around 08/29/2025) for renal duplex 1 year GS/FB.   Orvin FORBES Daring, NP      [1]  Allergies Allergen Reactions   Losartan  Other (See Comments)    Hyperkalemia, decreased GFR    [2]  Current Outpatient Medications on File Prior to Visit  Medication Sig Dispense Refill   acetaminophen (TYLENOL) 500 MG tablet Take 500 mg by mouth every 6 (six) hours as needed.     amLODipine  (NORVASC ) 5 MG tablet Take 1 tablet (5 mg total) by mouth daily. 90 tablet 3   brimonidine  (ALPHAGAN ) 0.2 % ophthalmic solution Place 1 drop into both eyes 2 (two) times daily.     Cholecalciferol (VITAMIN D3) 50 MCG (2000 UT) capsule Take 2,000 Units by mouth daily.     Cyanocobalamin  (VITAMIN B-12) 1000 MCG SUBL take 2000 mcg. daily 30 min. before noon  meal on empty stomach or if sublingual 1000 mcg. daily anytime     dorzolamide-timolol  (COSOPT) 22.3-6.8 MG/ML ophthalmic solution Place 1 drop into both eyes 2 (two) times daily.      LUMIGAN 0.01 % SOLN Place 1 drop into both eyes at bedtime.  rosuvastatin  (CRESTOR ) 5 MG tablet Take 1 tablet (5 mg total) by mouth daily. 90 tablet 3   No current facility-administered medications on file prior to visit.   "

## 2024-09-10 ENCOUNTER — Encounter (INDEPENDENT_AMBULATORY_CARE_PROVIDER_SITE_OTHER): Payer: Self-pay

## 2024-11-26 ENCOUNTER — Ambulatory Visit

## 2025-01-21 ENCOUNTER — Other Ambulatory Visit

## 2025-01-23 ENCOUNTER — Ambulatory Visit: Admitting: Internal Medicine

## 2025-09-16 ENCOUNTER — Encounter (INDEPENDENT_AMBULATORY_CARE_PROVIDER_SITE_OTHER)

## 2025-09-16 ENCOUNTER — Ambulatory Visit (INDEPENDENT_AMBULATORY_CARE_PROVIDER_SITE_OTHER): Admitting: Nurse Practitioner
